# Patient Record
Sex: Female | Born: 1940 | Race: Asian | Smoking: Never smoker
Health system: Southern US, Community
[De-identification: ages and names within clinical notes are randomized; demographics above are authoritative.]

## PROBLEM LIST (undated history)

## (undated) DIAGNOSIS — I10 Essential (primary) hypertension: Secondary | ICD-10-CM

## (undated) DIAGNOSIS — E119 Type 2 diabetes mellitus without complications: Secondary | ICD-10-CM

## (undated) DIAGNOSIS — E7800 Pure hypercholesterolemia, unspecified: Secondary | ICD-10-CM

## (undated) HISTORY — PX: CATARACT EXTRACTION: SUR2

---

## 2015-04-07 ENCOUNTER — Inpatient Hospital Stay
Admission: EM | Admit: 2015-04-07 | Discharge: 2015-04-09 | DRG: 287 | Disposition: A | Discharge status: Other Acute Inpatient Hospital | Payer: Medicare Other | Attending: Internal Medicine | Admitting: Internal Medicine

## 2015-04-07 DIAGNOSIS — Z886 Allergy status to analgesic agent status: Secondary | ICD-10-CM

## 2015-04-07 DIAGNOSIS — R748 Abnormal levels of other serum enzymes: Secondary | ICD-10-CM | POA: Diagnosis present

## 2015-04-07 DIAGNOSIS — E785 Hyperlipidemia, unspecified: Secondary | ICD-10-CM | POA: Diagnosis present

## 2015-04-07 DIAGNOSIS — I5041 Acute combined systolic (congestive) and diastolic (congestive) heart failure: Secondary | ICD-10-CM | POA: Diagnosis not present

## 2015-04-07 DIAGNOSIS — I248 Other forms of acute ischemic heart disease: Secondary | ICD-10-CM | POA: Diagnosis present

## 2015-04-07 DIAGNOSIS — I509 Heart failure, unspecified: Secondary | ICD-10-CM

## 2015-04-07 DIAGNOSIS — D649 Anemia, unspecified: Secondary | ICD-10-CM | POA: Diagnosis present

## 2015-04-07 DIAGNOSIS — K529 Noninfective gastroenteritis and colitis, unspecified: Secondary | ICD-10-CM | POA: Diagnosis present

## 2015-04-07 DIAGNOSIS — R7989 Other specified abnormal findings of blood chemistry: Secondary | ICD-10-CM

## 2015-04-07 DIAGNOSIS — W19XXXA Unspecified fall, initial encounter: Secondary | ICD-10-CM | POA: Diagnosis present

## 2015-04-07 DIAGNOSIS — Z9841 Cataract extraction status, right eye: Secondary | ICD-10-CM

## 2015-04-07 DIAGNOSIS — R778 Other specified abnormalities of plasma proteins: Secondary | ICD-10-CM

## 2015-04-07 DIAGNOSIS — Z9842 Cataract extraction status, left eye: Secondary | ICD-10-CM

## 2015-04-07 DIAGNOSIS — I10 Essential (primary) hypertension: Secondary | ICD-10-CM | POA: Diagnosis present

## 2015-04-07 DIAGNOSIS — R079 Chest pain, unspecified: Secondary | ICD-10-CM

## 2015-04-07 DIAGNOSIS — E119 Type 2 diabetes mellitus without complications: Secondary | ICD-10-CM | POA: Diagnosis present

## 2015-04-07 DIAGNOSIS — E780 Pure hypercholesterolemia: Secondary | ICD-10-CM | POA: Diagnosis present

## 2015-04-07 DIAGNOSIS — Z8249 Family history of ischemic heart disease and other diseases of the circulatory system: Secondary | ICD-10-CM

## 2015-04-07 HISTORY — DX: Type 2 diabetes mellitus without complications: E11.9

## 2015-04-07 HISTORY — DX: Pure hypercholesterolemia, unspecified: E78.00

## 2015-04-07 HISTORY — DX: Essential (primary) hypertension: I10

## 2015-04-07 NOTE — ED Notes (Signed)
Pt's daughter says she fell yesterday-mis-stepped; fell onto ground on left side; now having pain to the right side of her chest, especially with deep inspiration; small abrasion to area; tender to touch;

## 2015-04-08 ENCOUNTER — Encounter: Payer: Self-pay | Admitting: Emergency Medicine

## 2015-04-08 ENCOUNTER — Emergency Department: Payer: Medicare Other

## 2015-04-08 ENCOUNTER — Inpatient Hospital Stay
Admit: 2015-04-08 | Discharge: 2015-04-08 | Disposition: A | Discharge status: Home / Self Care | Payer: Medicare Other | Attending: Internal Medicine | Admitting: Internal Medicine

## 2015-04-08 DIAGNOSIS — E785 Hyperlipidemia, unspecified: Secondary | ICD-10-CM | POA: Diagnosis present

## 2015-04-08 DIAGNOSIS — I2510 Atherosclerotic heart disease of native coronary artery without angina pectoris: Secondary | ICD-10-CM | POA: Diagnosis present

## 2015-04-08 DIAGNOSIS — D649 Anemia, unspecified: Secondary | ICD-10-CM | POA: Diagnosis present

## 2015-04-08 DIAGNOSIS — W19XXXA Unspecified fall, initial encounter: Secondary | ICD-10-CM | POA: Diagnosis present

## 2015-04-08 DIAGNOSIS — I5041 Acute combined systolic (congestive) and diastolic (congestive) heart failure: Secondary | ICD-10-CM | POA: Diagnosis present

## 2015-04-08 DIAGNOSIS — R7989 Other specified abnormal findings of blood chemistry: Secondary | ICD-10-CM | POA: Diagnosis present

## 2015-04-08 DIAGNOSIS — I509 Heart failure, unspecified: Secondary | ICD-10-CM

## 2015-04-08 DIAGNOSIS — R748 Abnormal levels of other serum enzymes: Secondary | ICD-10-CM | POA: Diagnosis present

## 2015-04-08 DIAGNOSIS — R079 Chest pain, unspecified: Secondary | ICD-10-CM | POA: Diagnosis not present

## 2015-04-08 DIAGNOSIS — Z9842 Cataract extraction status, left eye: Secondary | ICD-10-CM | POA: Diagnosis not present

## 2015-04-08 DIAGNOSIS — K529 Noninfective gastroenteritis and colitis, unspecified: Secondary | ICD-10-CM | POA: Diagnosis present

## 2015-04-08 DIAGNOSIS — Z8249 Family history of ischemic heart disease and other diseases of the circulatory system: Secondary | ICD-10-CM | POA: Diagnosis not present

## 2015-04-08 DIAGNOSIS — E780 Pure hypercholesterolemia: Secondary | ICD-10-CM | POA: Diagnosis present

## 2015-04-08 DIAGNOSIS — I10 Essential (primary) hypertension: Secondary | ICD-10-CM | POA: Diagnosis present

## 2015-04-08 DIAGNOSIS — E119 Type 2 diabetes mellitus without complications: Secondary | ICD-10-CM | POA: Diagnosis present

## 2015-04-08 DIAGNOSIS — Z9841 Cataract extraction status, right eye: Secondary | ICD-10-CM | POA: Diagnosis not present

## 2015-04-08 DIAGNOSIS — Z886 Allergy status to analgesic agent status: Secondary | ICD-10-CM | POA: Diagnosis not present

## 2015-04-08 DIAGNOSIS — I248 Other forms of acute ischemic heart disease: Secondary | ICD-10-CM | POA: Diagnosis present

## 2015-04-08 LAB — BASIC METABOLIC PANEL
ANION GAP: 9 (ref 5–15)
Anion gap: 7 (ref 5–15)
BUN: 15 mg/dL (ref 6–20)
BUN: 20 mg/dL (ref 6–20)
CALCIUM: 8.8 mg/dL — AB (ref 8.9–10.3)
CALCIUM: 8.8 mg/dL — AB (ref 8.9–10.3)
CO2: 27 mmol/L (ref 22–32)
CO2: 28 mmol/L (ref 22–32)
CREATININE: 0.8 mg/dL (ref 0.44–1.00)
Chloride: 104 mmol/L (ref 101–111)
Chloride: 97 mmol/L — ABNORMAL LOW (ref 101–111)
Creatinine, Ser: 0.72 mg/dL (ref 0.44–1.00)
GFR calc non Af Amer: >60 mL/min (ref >60)
GFR calc non Af Amer: >60 mL/min (ref >60)
GLUCOSE: 183 mg/dL — AB (ref 65–99)
Glucose, Bld: 206 mg/dL — ABNORMAL HIGH (ref 65–99)
POTASSIUM: 4 mmol/L (ref 3.5–5.1)
POTASSIUM: 4 mmol/L (ref 3.5–5.1)
SODIUM: 138 mmol/L (ref 135–145)
Sodium: 134 mmol/L — ABNORMAL LOW (ref 135–145)

## 2015-04-08 LAB — CBC
HCT: 32.2 % — ABNORMAL LOW (ref 35.0–47.0)
HEMATOCRIT: 30.1 % — AB (ref 35.0–47.0)
HEMATOCRIT: 30 % — AB (ref 35.0–47.0)
HEMOGLOBIN: 10.9 g/dL — AB (ref 12.0–16.0)
HEMOGLOBIN: 10.3 g/dL — AB (ref 12.0–16.0)
Hemoglobin: 10.4 g/dL — ABNORMAL LOW (ref 12.0–16.0)
MCH: 30.4 pg (ref 26.0–34.0)
MCH: 30.9 pg (ref 26.0–34.0)
MCH: 31 pg (ref 26.0–34.0)
MCHC: 33.7 g/dL (ref 32.0–36.0)
MCHC: 34.3 g/dL (ref 32.0–36.0)
MCHC: 34.6 g/dL (ref 32.0–36.0)
MCV: 90.2 fL (ref 80.0–100.0)
MCV: 90 fL (ref 80.0–100.0)
MCV: 89.7 fL (ref 80.0–100.0)
PLATELETS: 261 10*3/uL (ref 150–440)
PLATELETS: 254 10*3/uL (ref 150–440)
Platelets: 272 10*3/uL (ref 150–440)
RBC: 3.57 MIL/uL — AB (ref 3.80–5.20)
RBC: 3.34 MIL/uL — ABNORMAL LOW (ref 3.80–5.20)
RBC: 3.35 MIL/uL — AB (ref 3.80–5.20)
RDW: 14.7 % — ABNORMAL HIGH (ref 11.5–14.5)
RDW: 14.8 % — ABNORMAL HIGH (ref 11.5–14.5)
RDW: 14.8 % — ABNORMAL HIGH (ref 11.5–14.5)
WBC: 8.4 10*3/uL (ref 3.6–11.0)
WBC: 9.5 10*3/uL (ref 3.6–11.0)
WBC: 9.8 10*3/uL (ref 3.6–11.0)

## 2015-04-08 LAB — APTT: APTT: 32 s (ref 24–36)

## 2015-04-08 LAB — GLUCOSE, CAPILLARY
GLUCOSE-CAPILLARY: 186 mg/dL — AB (ref 65–99)
GLUCOSE-CAPILLARY: 215 mg/dL — AB (ref 65–99)
GLUCOSE-CAPILLARY: 154 mg/dL — AB (ref 65–99)
Glucose-Capillary: 205 mg/dL — ABNORMAL HIGH (ref 65–99)
Glucose-Capillary: 194 mg/dL — ABNORMAL HIGH (ref 65–99)

## 2015-04-08 LAB — COMPREHENSIVE METABOLIC PANEL
ALT: 29 U/L (ref 14–54)
ANION GAP: 9 (ref 5–15)
AST: 32 U/L (ref 15–41)
Albumin: 3.4 g/dL — ABNORMAL LOW (ref 3.5–5.0)
Alkaline Phosphatase: 79 U/L (ref 38–126)
BUN: 15 mg/dL (ref 6–20)
CO2: 27 mmol/L (ref 22–32)
Calcium: 9 mg/dL (ref 8.9–10.3)
Chloride: 101 mmol/L (ref 101–111)
Creatinine, Ser: 0.66 mg/dL (ref 0.44–1.00)
GFR calc Af Amer: >60 mL/min (ref >60)
Glucose, Bld: 203 mg/dL — ABNORMAL HIGH (ref 65–99)
POTASSIUM: 3.9 mmol/L (ref 3.5–5.1)
SODIUM: 137 mmol/L (ref 135–145)
Total Bilirubin: 0.5 mg/dL (ref 0.3–1.2)
Total Protein: 6.7 g/dL (ref 6.5–8.1)

## 2015-04-08 LAB — TROPONIN I
TROPONIN I: 0.49 ng/mL — AB (ref <0.031)
TROPONIN I: 0.48 ng/mL — AB (ref <0.031)
Troponin I: 0.45 ng/mL — ABNORMAL HIGH (ref <0.031)

## 2015-04-08 LAB — HEPARIN LEVEL (UNFRACTIONATED)
HEPARIN UNFRACTIONATED: 0.44 [IU]/mL (ref 0.30–0.70)
Heparin Unfractionated: 0.18 IU/mL — ABNORMAL LOW (ref 0.30–0.70)

## 2015-04-08 LAB — BRAIN NATRIURETIC PEPTIDE: B Natriuretic Peptide: 542 pg/mL — ABNORMAL HIGH (ref 0.0–100.0)

## 2015-04-08 LAB — PROTIME-INR
INR: 0.99
Prothrombin Time: 13.3 seconds (ref 11.4–15.0)

## 2015-04-08 LAB — HEMOGLOBIN A1C: Hgb A1c MFr Bld: 7.2 % — ABNORMAL HIGH (ref 4.0–6.0)

## 2015-04-08 MED ORDER — ONDANSETRON HCL 4 MG PO TABS: 4.0000 mg | ORAL_TABLET | Freq: Four times a day (QID) | ORAL | Status: DC | PRN

## 2015-04-08 MED ORDER — INSULIN ASPART 100 UNIT/ML ~~LOC~~ SOLN
0.0000 [IU] | Freq: Three times a day (TID) | SUBCUTANEOUS | Status: DC
  Administered 2015-04-08: 3 [IU] via SUBCUTANEOUS
  Administered 2015-04-08: 2 [IU] via SUBCUTANEOUS
  Administered 2015-04-08: 3 [IU] via SUBCUTANEOUS
  Filled 2015-04-08: qty 2
  Filled 2015-04-08 (×2): qty 3

## 2015-04-08 MED ORDER — ONDANSETRON HCL 4 MG/2ML IJ SOLN: 4.0000 mg | Freq: Four times a day (QID) | INTRAMUSCULAR | Status: DC | PRN

## 2015-04-08 MED ORDER — HEPARIN (PORCINE) IN NACL 100-0.45 UNIT/ML-% IJ SOLN
800.0000 [IU]/h | INTRAMUSCULAR | Status: DC
  Filled 2015-04-08 (×3): qty 250

## 2015-04-08 MED ORDER — SODIUM CHLORIDE 0.9 % IJ SOLN: 3.0000 mL | INTRAMUSCULAR | Status: DC | PRN

## 2015-04-08 MED ORDER — ASPIRIN 81 MG PO CHEW
81.0000 mg | CHEWABLE_TABLET | ORAL | Status: AC
  Administered 2015-04-09: 81 mg via ORAL
  Filled 2015-04-08: qty 1

## 2015-04-08 MED ORDER — PNEUMOCOCCAL 13-VAL CONJ VACC IM SUSP: 0.5000 mL | INTRAMUSCULAR | Status: DC

## 2015-04-08 MED ORDER — HEPARIN BOLUS VIA INFUSION
2600.0000 [IU] | Freq: Once | INTRAVENOUS | Status: AC
  Administered 2015-04-08: 2600 [IU] via INTRAVENOUS
  Filled 2015-04-08: qty 2600

## 2015-04-08 MED ORDER — CARVEDILOL 6.25 MG PO TABS
6.2500 mg | ORAL_TABLET | Freq: Two times a day (BID) | ORAL | Status: DC
  Administered 2015-04-08 – 2015-04-09 (×3): 6.25 mg via ORAL
  Filled 2015-04-08 (×3): qty 1

## 2015-04-08 MED ORDER — SODIUM CHLORIDE 0.9 % IJ SOLN
3.0000 mL | Freq: Two times a day (BID) | INTRAMUSCULAR | Status: DC
  Administered 2015-04-09: 3 mL via INTRAVENOUS

## 2015-04-08 MED ORDER — SODIUM CHLORIDE 0.9 % WEIGHT BASED INFUSION
3.0000 mL/kg/h | INTRAVENOUS | Status: AC
  Administered 2015-04-09: 3 mL/kg/h via INTRAVENOUS

## 2015-04-08 MED ORDER — MORPHINE SULFATE 2 MG/ML IJ SOLN: 2.0000 mg | INTRAMUSCULAR | Status: DC | PRN

## 2015-04-08 MED ORDER — SODIUM CHLORIDE 0.9 % IV SOLN: 250.0000 mL | INTRAVENOUS | Status: DC | PRN

## 2015-04-08 MED ORDER — ACETAMINOPHEN 325 MG PO TABS: 650.0000 mg | ORAL_TABLET | Freq: Four times a day (QID) | ORAL | Status: DC | PRN

## 2015-04-08 MED ORDER — OXYCODONE-ACETAMINOPHEN 5-325 MG PO TABS
1.0000 | ORAL_TABLET | Freq: Once | ORAL | Status: AC
  Administered 2015-04-08: 1 via ORAL
  Filled 2015-04-08: qty 1

## 2015-04-08 MED ORDER — FUROSEMIDE 10 MG/ML IJ SOLN
20.0000 mg | Freq: Two times a day (BID) | INTRAMUSCULAR | Status: DC
  Administered 2015-04-08 – 2015-04-09 (×3): 20 mg via INTRAVENOUS
  Filled 2015-04-08 (×3): qty 2

## 2015-04-08 MED ORDER — INSULIN ASPART 100 UNIT/ML ~~LOC~~ SOLN: 0.0000 [IU] | Freq: Every day | SUBCUTANEOUS | Status: DC

## 2015-04-08 MED ORDER — ALBUTEROL SULFATE (2.5 MG/3ML) 0.083% IN NEBU: 2.5000 mg | INHALATION_SOLUTION | RESPIRATORY_TRACT | Status: DC | PRN

## 2015-04-08 MED ORDER — HEPARIN (PORCINE) IN NACL 100-0.45 UNIT/ML-% IJ SOLN
650.0000 [IU]/h | INTRAMUSCULAR | Status: DC
  Filled 2015-04-08: qty 250

## 2015-04-08 MED ORDER — ATORVASTATIN CALCIUM 20 MG PO TABS
40.0000 mg | ORAL_TABLET | Freq: Every day | ORAL | Status: DC
  Administered 2015-04-08: 40 mg via ORAL
  Filled 2015-04-08: qty 2

## 2015-04-08 MED ORDER — HEPARIN (PORCINE) IN NACL 100-0.45 UNIT/ML-% IJ SOLN
12.0000 [IU]/kg/h | INTRAMUSCULAR | Status: DC
  Administered 2015-04-08: 12 [IU]/kg/h via INTRAVENOUS
  Filled 2015-04-08: qty 250

## 2015-04-08 MED ORDER — SODIUM CHLORIDE 0.9 % WEIGHT BASED INFUSION: 1.0000 mL/kg/h | INTRAVENOUS | Status: DC

## 2015-04-08 MED ORDER — HEPARIN BOLUS VIA INFUSION
1300.0000 [IU] | Freq: Once | INTRAVENOUS | Status: AC
  Administered 2015-04-08: 1300 [IU] via INTRAVENOUS
  Filled 2015-04-08: qty 1300

## 2015-04-08 MED ORDER — ACETAMINOPHEN 650 MG RE SUPP: 650.0000 mg | Freq: Four times a day (QID) | RECTAL | Status: DC | PRN

## 2015-04-08 MED ORDER — LISINOPRIL 10 MG PO TABS
10.0000 mg | ORAL_TABLET | Freq: Every day | ORAL | Status: DC
  Administered 2015-04-08 – 2015-04-09 (×2): 10 mg via ORAL
  Filled 2015-04-08 (×2): qty 1

## 2015-04-08 MED ORDER — HYDROCODONE-ACETAMINOPHEN 5-325 MG PO TABS
1.0000 | ORAL_TABLET | ORAL | Status: DC | PRN
  Administered 2015-04-08: 1 via ORAL
  Filled 2015-04-08: qty 1

## 2015-04-08 MED ORDER — SODIUM CHLORIDE 0.9 % IJ SOLN
3.0000 mL | Freq: Two times a day (BID) | INTRAMUSCULAR | Status: DC
  Administered 2015-04-08: 3 mL via INTRAVENOUS

## 2015-04-08 NOTE — Progress Notes (Signed)
Pt refuses to use intrepetor. NSR. Room air. EF 60-65%. Pt reported HA and received norco. Family at the bedside. Heparin drip at 8 ml/hr. Pt has no further concerns at this time.

## 2015-04-08 NOTE — Plan of Care (Signed)
Problem: Consults Goal: Heart Failure Patient Education (See Patient Education module for education specifics.)  Outcome: Progressing Unable to do due to language barrier

## 2015-04-08 NOTE — ED Provider Notes (Signed)
[facility] Emergency Department Provider Note  ____________________________________________  Time seen: Approximately 0040 AM  I have reviewed the triage vital signs and the nursing notes.   HISTORY  Chief Complaint Fall and Chest Pain  There is a language barrier  HPI Janice Winters is a 74 y.o. female who fell yesterday and hit her right side. [name] reports thatthe patient was leaning over and fell onto her right side. The patient reports now though that she feels as though she can't take a deep breath because it hurts on that side. The patient has been taking Tylenol for pain but it has not been helping. Her pain is 8 out of 10 in intensity. The patient did not have any pain before she fell but currently she has pain. She did not hit her head. She is felt gas in her stomach but has not had any vomiting. The patient's family came in because she was having so much discomfort.   Past Medical History  Diagnosis Date  . Hypertension   . Diabetes mellitus without complication   . High cholesterol     Patient Active Problem List   Diagnosis Date Noted  . Acute CHF 04/08/2015    Past Surgical History  Procedure Laterality Date  . Cataract extraction Bilateral     No current outpatient prescriptions on file.  Allergies Aspirin  History reviewed. No pertinent family history.  Social History History  Substance Use Topics  . Smoking status: Never Smoker   . Smokeless tobacco: Never Used  . Alcohol Use: No    Review of Systems Constitutional: No fever/chills Eyes: No visual changes. ENT: No sore throat. Cardiovascular:chest pain. Respiratory: shortness of breath. Gastrointestinal: No abdominal pain.  No nausea, no vomiting.  No diarrhea.  No constipation. Genitourinary: Negative for dysuria. Musculoskeletal: Negative for back pain. Skin: Negative for rash. Neurological: Negative for headaches, focal weakness or  numbness.  10-point ROS otherwise negative.  ____________________________________________   PHYSICAL EXAM:  VITAL SIGNS: ED Triage Vitals  Enc Vitals Group     BP 04/07/15 2248 139/70 mmHg     Pulse Rate 04/07/15 2248 82     Resp --      Temp 04/07/15 2248 98.2 F (36.8 C)     Temp Source 04/07/15 2248 Oral     SpO2 04/07/15 2248 94 %     Weight 04/07/15 2248 99 lb (44.906 kg)     Height 04/07/15 2248 4\' 9"  (1.448 m)     Head Cir --      Peak Flow --      Pain Score 04/07/15 2248 8     Pain Loc --      Pain Edu? --      Excl. in GC? --     Constitutional: Alert and oriented. Well appearing and in no acute distress. Eyes: Conjunctivae are normal. PERRL. EOMI. Head: Atraumatic. Nose: No congestion/rhinnorhea. Mouth/Throat: Mucous membranes are moist.  Oropharynx non-erythematous. Neck: No cervical spine tenderness to palpation. Cardiovascular: Normal rate, regular rhythm. Grossly normal heart sounds.  Good peripheral circulation. Respiratory: Normal respiratory effort.  No retractions. Crackles in bilateral bases. Tenderness to palpation along patient's right lateral chest Gastrointestinal: Soft and nontender. No distention. Positive bowel sounds Genitourinary: Deferred Musculoskeletal: No lower extremity tenderness nor edema.   Neurologic:  Normal speech and language. No gross focal neurologic deficits are appreciated.  Skin:  Bruising to patient's right side Psychiatric: Mood and affect are normal.   ____________________________________________  LABS (all labs ordered are listed, but only abnormal results are displayed)  Labs Reviewed  CBC - Abnormal; Notable for the following:    RBC 3.57 (*)    Hemoglobin 10.9 (*)    HCT 32.2 (*)    RDW 14.7 (*)    All other components within normal limits  COMPREHENSIVE METABOLIC PANEL - Abnormal; Notable for the following:    Glucose, Bld 203 (*)    Albumin 3.4 (*)    All other components within normal limits  TROPONIN  I - Abnormal; Notable for the following:    Troponin I 0.49 (*)    All other components within normal limits  BRAIN NATRIURETIC PEPTIDE - Abnormal; Notable for the following:    B Natriuretic Peptide 542.0 (*)    All other components within normal limits   ____________________________________________  EKG  ED ECG REPORT I, Rebecka Apley, the attending physician, personally viewed and interpreted this ECG.   Date: 04/07/2015  EKG Time: 2254  Rate: 79  Rhythm: normal sinus rhythm  Axis: normal  Intervals:none  ST&T Change: none  ____________________________________________  RADIOLOGY  Chest x-ray: Vascular congestion and mild cardiomegaly with increased interstitial markings concerning for mild interstitial edema ____________________________________________   PROCEDURES  Procedure(s) performed: None  Critical Care performed: No  ____________________________________________   INITIAL IMPRESSION / ASSESSMENT AND PLAN / ED COURSE  Pertinent labs & imaging results that were available during my care of the patient were reviewed by me and considered in my medical decision making (see chart for details).  This is a 74 year old female who comes in today with some chest pain after a fall with some bruising and tenderness to palpation along the area where she fell. The patient does have some crackles in her lung bases so I did some blood work. On this blood work it was found that the patient had an abnormal troponin of 0.49. I will admit the patient to the hospital for further evaluation of this elevated troponin. The patient also did receive a dose of Percocet for her pain. I discussed the case with the hospitalist and they would like the patient started on heparin for her chest pain. ____________________________________________   FINAL CLINICAL IMPRESSION(S) / ED DIAGNOSES  Final diagnoses:  Chest pain, unspecified chest pain type  Elevated troponin      Rebecka Apley, MD 04/08/15 [phone]

## 2015-04-08 NOTE — Progress Notes (Addendum)
[facility] [place] [facility]   PATIENT NAME: Janice Winters    MR#:  [id number]  DATE OF BIRTH:  Apr 10, 1941  SUBJECTIVE:  Patient's daughter is at bedside. Patient is not having any pain at this time. She has bouts of shortness of breath. She denies any chest pain or abdominal pain.  REVIEW OF SYSTEMS:    Review of Systems  Constitutional: Negative for fever, chills and malaise/fatigue.  HENT: Negative for sore throat.   Eyes: Negative for blurred vision.  Respiratory: Positive for shortness of breath. Negative for cough, hemoptysis and wheezing.   Cardiovascular: Positive for leg swelling. Negative for chest pain and palpitations.  Gastrointestinal: Negative for nausea, vomiting, abdominal pain, diarrhea and blood in stool.  Genitourinary: Negative for dysuria.  Musculoskeletal: Negative for back pain.  Neurological: Negative for dizziness, tremors and headaches.  Endo/Heme/Allergies: Does not bruise/bleed easily.    Tolerating Diet: Yes      DRUG ALLERGIES:   Allergies  Allergen Reactions  . Aspirin Diarrhea    VITALS:  Blood pressure 156/68, pulse 91, temperature 97.8 F (36.6 C), temperature source Oral, resp. rate 20, height 4\' 9"  (1.448 m), weight 43.863 kg (96 lb 11.2 oz), SpO2 96 %.  PHYSICAL EXAMINATION:   Physical Exam  Constitutional: She is oriented to person, place, and time and well-developed, well-nourished, and in no distress. No distress.  HENT:  Head: Normocephalic.  Eyes: No scleral icterus.  Neck: Normal range of motion. Neck supple. No JVD present. No tracheal deviation present.  Cardiovascular: Normal rate, regular rhythm and normal heart sounds.  Exam reveals no gallop and no friction rub.   No murmur heard. Pulmonary/Chest: Effort normal and breath sounds normal. No respiratory distress. She has no wheezes. She has no rales. She exhibits no tenderness.  Abdominal: Soft. Bowel sounds are normal. She exhibits no  distension and no mass. There is no tenderness. There is no rebound and no guarding.  Musculoskeletal: Normal range of motion. She exhibits edema.  Neurological: She is alert and oriented to person, place, and time.  Skin: Skin is warm. No rash noted. No erythema.  Psychiatric: Affect and judgment normal.      LABORATORY PANEL:   CBC  Recent Labs Lab 04/08/15 0525  WBC 9.8  HGB 10.3*  HCT 30.1*  PLT 261   ------------------------------------------------------------------------------------------------------------------  Chemistries   Recent Labs Lab 04/08/15 0156 04/08/15 0525  NA 137 138  K 3.9 4.0  CL 101 104  CO2 27 27  GLUCOSE 203* 183*  BUN 15 15  CREATININE 0.66 0.72  CALCIUM 9.0 8.8*  AST 32  --   ALT 29  --   ALKPHOS 79  --   BILITOT 0.5  --    ------------------------------------------------------------------------------------------------------------------  Cardiac Enzymes  Recent Labs Lab 04/08/15 0156 04/08/15 0525  TROPONINI 0.49* 0.48*   ------------------------------------------------------------------------------------------------------------------  RADIOLOGY:  Dg Chest 2 View  04/08/2015   CLINICAL DATA:  Status post fall, with right anterior and lateral chest pain. Initial encounter.  EXAM: CHEST  2 VIEW  COMPARISON:  None.  FINDINGS: The lungs are well-aerated. Vascular congestion is noted, with increased interstitial markings, concerning for mild interstitial edema. There is no evidence of pleural effusion or pneumothorax.  The heart is mildly enlarged. No acute osseous abnormalities are seen.  IMPRESSION: Vascular congestion and mild cardiomegaly, with increased interstitial markings, concerning for mild interstitial edema.   Electronically Signed   By: Roanna Raider M.D.   On: 04/08/2015 01:01  ASSESSMENT AND PLAN:   74 year old female with history of essential hypertension, diabetes who presented to the ER after a fall and  complaining of chest pain and found to have CHF and lower extremity edema.  1. Acute CHF exacerbation: Chest x-ray is consistent with pulmonary edema as well as a slightly elevated BNP of over 500. Patient does have some very minimal lower extremity edema. Her lungs on examination have no crackles. I will continue Lasix. I have ordered a 2-D echo Cardigan to further evaluate cardiac function. Cardiac consultation has been placed.  She will also continue Coreg. Continue to monitor I's and O's and daily.  2. Elevated troponin: This is likely secondary to acute exacerbation of CHF and may be due to supply demand ischemia. Patient is currently a heparin drip. Her troponin is trending downward. Cardiology has been consulted. I will continue heparin until further evaluation and management as per cardiology. Patient should continue atorvastatin and Coreg. She is allergic to aspirin  3. Diabetes: Patient is currently on sliding scale insulin which I will continue.  4. Hypertension: Patient's on Coreg and lisinopril. Blood pressure is acceptable.   Management plans discussed with the patient and daughter and they are in agreement.  CODE STATUS: Full  TOTAL TIME TAKING CARE OF THIS PATIENT: 30 minutes.     POSSIBLE D/C tomorrow, DEPENDING ON CLINICAL CONDITION.   Dulce Martian M.D on 04/08/2015 at 10:11 AM  Between 7am to 6pm - Pager - [phone] After 6pm go to [email or web address] - password [email or web address] [facility]  [place] [facility]  Office  [phone]  CC: Primary care physician; No primary care provider on file.

## 2015-04-08 NOTE — Progress Notes (Signed)
ANTICOAGULATION CONSULT NOTE - Initial Consult  Pharmacy Consult for Heparin Indication: chest pain/ACS  Allergies  Allergen Reactions  . Aspirin Diarrhea    Patient Measurements: Height:  (144.8 cm) Weight: 96 lb 11.2 oz (43.863 kg) IBW/kg (Calculated) : 38.6 Heparin Dosing Weight: 43.9 kg  Vital Signs: Temp: 98.5 F (36.9 C) (08/07 1130) Temp Source: Oral (08/07 1130) BP: 129/59 mmHg (08/07 1130) Pulse Rate: 92 (08/07 1130)  Labs:  Recent Labs  04/08/15 0156 04/08/15 0525 04/08/15 1350  HGB 10.9* 10.3*  --   HCT 32.2* 30.1*  --   PLT 272 261  --   APTT 32  --   --   LABPROT 13.3  --   --   INR 0.99  --   --   HEPARINUNFRC  --   --  0.18*  CREATININE 0.66 0.72  --   TROPONINI 0.49* 0.48* 0.45*    Estimated Creatinine Clearance: 37.6 mL/min (by C-G formula based on Cr of 0.72).   Medical History: Past Medical History  Diagnosis Date  . Hypertension   . Diabetes mellitus without complication   . High cholesterol     Medications:  Scheduled:  . atorvastatin  40 mg Oral q1800  . carvedilol  6.25 mg Oral BID WC  . furosemide  20 mg Intravenous Q12H  . insulin aspart  0-5 Units Subcutaneous QHS  . insulin aspart  0-9 Units Subcutaneous TID WC  . lisinopril  10 mg Oral Daily  . [START ON 04/09/2015] pneumococcal 13-valent conjugate vaccine  0.5 mL Intramuscular Tomorrow-1000  . sodium chloride  3 mL Intravenous Q12H   Infusions:  . heparin 12 Units/kg/hr (04/08/15 0610)   PRN: sodium chloride, acetaminophen **OR** acetaminophen, albuterol, HYDROcodone-acetaminophen, morphine injection, ondansetron **OR** ondansetron (ZOFRAN) IV, sodium chloride  Assessment: 74 y/o F with admitted with acute CHF and elevated troponin, r/o NSTEMI. Per RN, patient's daughter reports no anticoagulants PTA.   Heparin level subtherapeutic  Goal of Therapy:  Heparin level 0.3-0.7 units/ml Monitor platelets by anticoagulation protocol: Yes   Plan:  Current orders for  heparin 650 units/hr. Will order bolus of 1300 units and increase rate to 800 units/hr  Will recheck heparin level in 8 hours, CBC with AM labs.  Garlon Hatchet, PharmD Clinical Pharmacist  04/08/2015,3:00 PM

## 2015-04-08 NOTE — Progress Notes (Signed)
ANTICOAGULATION CONSULT NOTE - Initial Consult  Pharmacy Consult for Heparin Indication: chest pain/ACS  Allergies  Allergen Reactions  . Aspirin Diarrhea    Patient Measurements: Height:  (144.8 cm) Weight: 96 lb 11.2 oz (43.863 kg) IBW/kg (Calculated) : 38.6 Heparin Dosing Weight: 43.9 kg  Vital Signs: Temp: 97.8 F (36.6 C) (08/07 0506) Temp Source: Oral (08/07 0506) BP: 149/65 mmHg (08/07 0506) Pulse Rate: 79 (08/07 0506)  Labs:  Recent Labs  04/08/15 0156  HGB 10.9*  HCT 32.2*  PLT 272  CREATININE 0.66  TROPONINI 0.49*    Estimated Creatinine Clearance: 37.6 mL/min (by C-G formula based on Cr of 0.66).   Medical History: Past Medical History  Diagnosis Date  . Hypertension   . Diabetes mellitus without complication   . High cholesterol     Medications:  Scheduled:  . atorvastatin  40 mg Oral q1800  . carvedilol  6.25 mg Oral BID WC  . furosemide  20 mg Intravenous Q12H  . heparin  2,600 Units Intravenous Once  . insulin aspart  0-5 Units Subcutaneous QHS  . insulin aspart  0-9 Units Subcutaneous TID WC  . lisinopril  10 mg Oral Daily  . sodium chloride  3 mL Intravenous Q12H   Infusions:  . heparin     PRN: sodium chloride, acetaminophen **OR** acetaminophen, albuterol, HYDROcodone-acetaminophen, morphine injection, ondansetron **OR** ondansetron (ZOFRAN) IV, sodium chloride  Assessment: 74 y/o F with admitted with acute CHF and elevated troponin, r/o NSTEMI. Per RN, patient's daughter reports no anticoagulants PTA.   Goal of Therapy:  Heparin level 0.3-0.7 units/ml Monitor platelets by anticoagulation protocol: Yes   Plan:  Give 2600 units bolus x 1 Start heparin infusion at 650 units/hr Check anti-Xa level in 8 hours and daily while on heparin Continue to monitor H&H and platelets  Numa Heatwole D 04/08/2015,5:31 AM

## 2015-04-08 NOTE — Progress Notes (Signed)
Janice Winters is a 74 y.o. female  [id number]  Primary Cardiologist: Adrian Blackwater Reason for Consultation: CHF and elevated troponin  HPI: This is a 74 year old female from [place], presented to the emergency room with shortness of breath and pain in the chest. The chest pain actually was on the right side after falling down. According to the family member she fell on Friday and came to the hospital yesterday because she was very short of breath. Her troponin was 0.49 and chest x-ray showed pulmonary edema. She still having intermittent shortness of breath and chest pain. She has multiple risk factor for coronary artery disease including hypertension diabetes hyperlipidemia and heart age.Review of Systems  Respiratory: Positive for cough, shortness of breath and wheezing.   All other systems reviewed and are negative.        Past Medical History  Diagnosis Date  . Hypertension   . Diabetes mellitus without complication   . High cholesterol     No prescriptions prior to admission     . atorvastatin  40 mg Oral q1800  . carvedilol  6.25 mg Oral BID WC  . furosemide  20 mg Intravenous Q12H  . insulin aspart  0-5 Units Subcutaneous QHS  . insulin aspart  0-9 Units Subcutaneous TID WC  . lisinopril  10 mg Oral Daily  . [START ON 04/09/2015] pneumococcal 13-valent conjugate vaccine  0.5 mL Intramuscular Tomorrow-1000  . sodium chloride  3 mL Intravenous Q12H    Infusions: . heparin 12 Units/kg/hr (04/08/15 0610)    Allergies  Allergen Reactions  . Aspirin Diarrhea    History   Social History  . Marital Status: Widowed    Spouse Name: N/A  . Number of Children: N/A  . Years of Education: N/A   Occupational History  . Not on file.   Social History Main Topics  . Smoking status: Never Smoker   . Smokeless tobacco: Never Used  . Alcohol Use: No  . Drug Use: No  . Sexual Activity: No   Other Topics Concern  . Not on file   Social History Narrative  . No narrative on  file    Family History  Problem Relation Age of Onset  . Hypertension Other     PHYSICAL EXAM: Filed Vitals:   04/08/15 1130  BP: 129/59  Pulse: 92  Temp: 98.5 F (36.9 C)  Resp: 18     Intake/Output Summary (Last 24 hours) at 04/08/15 1157 Last data filed at 04/08/15 0952  Gross per 24 hour  Intake 244.58 ml  Output    600 ml  Net -355.42 ml    General:  Well appearing. No respiratory difficulty HEENT: normal Neck: supple. no JVD. Carotids 2+ bilat; no bruits. No lymphadenopathy or thryomegaly appreciated. Cor: PMI nondisplaced. Regular rate & rhythm. No rubs, gallops or murmurs. Lungs: clear Abdomen: soft, nontender, nondistended. No hepatosplenomegaly. No bruits or masses. Good bowel sounds. Extremities: no cyanosis, clubbing, rash, edema Neuro: alert & oriented x 3, cranial nerves grossly intact. moves all 4 extremities w/o difficulty. Affect pleasant.  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  Results for orders placed or performed during the hospital encounter of 04/07/15 (from the past 24 hour(s))  CBC     Status: Abnormal   Collection Time: 04/08/15  1:56 AM  Result Value Ref Range   WBC 9.5 3.6 - 11.0 K/uL   RBC 3.57 (L) 3.80 - 5.20 MIL/uL   Hemoglobin  10.9 (L) 12.0 - 16.0 g/dL   HCT 16.1 (L) 09.6 - 04.5 %   MCV 90.2 80.0 - 100.0 fL   MCH 30.4 26.0 - 34.0 pg   MCHC 33.7 32.0 - 36.0 g/dL   RDW 40.9 (H) 81.1 - 91.4 %   Platelets 272 150 - 440 K/uL  Comprehensive metabolic panel     Status: Abnormal   Collection Time: 04/08/15  1:56 AM  Result Value Ref Range   Sodium 137 135 - 145 mmol/L   Potassium 3.9 3.5 - 5.1 mmol/L   Chloride 101 101 - 111 mmol/L   CO2 27 22 - 32 mmol/L   Glucose, Bld 203 (H) 65 - 99 mg/dL   BUN 15 6 - 20 mg/dL   Creatinine, Ser 7.82 0.44 - 1.00 mg/dL   Calcium 9.0 8.9 - 95.6 mg/dL   Total Protein 6.7 6.5 - 8.1 g/dL   Albumin 3.4 (L) 3.5 - 5.0 g/dL   AST 32 15 - 41 U/L   ALT 29 14  - 54 U/L   Alkaline Phosphatase 79 38 - 126 U/L   Total Bilirubin 0.5 0.3 - 1.2 mg/dL   GFR calc non Af Amer >60 >60 mL/min   GFR calc Af Amer >60 >60 mL/min   Anion gap 9 5 - 15  Troponin I     Status: Abnormal   Collection Time: 04/08/15  1:56 AM  Result Value Ref Range   Troponin I 0.49 (H) <0.031 ng/mL  APTT     Status: None   Collection Time: 04/08/15  1:56 AM  Result Value Ref Range   aPTT 32 24 - 36 seconds  Protime-INR     Status: None   Collection Time: 04/08/15  1:56 AM  Result Value Ref Range   Prothrombin Time 13.3 11.4 - 15.0 seconds   INR 0.99   Brain natriuretic peptide     Status: Abnormal   Collection Time: 04/08/15  1:58 AM  Result Value Ref Range   B Natriuretic Peptide 542.0 (H) 0.0 - 100.0 pg/mL  Basic metabolic panel     Status: Abnormal   Collection Time: 04/08/15  5:25 AM  Result Value Ref Range   Sodium 138 135 - 145 mmol/L   Potassium 4.0 3.5 - 5.1 mmol/L   Chloride 104 101 - 111 mmol/L   CO2 27 22 - 32 mmol/L   Glucose, Bld 183 (H) 65 - 99 mg/dL   BUN 15 6 - 20 mg/dL   Creatinine, Ser 2.13 0.44 - 1.00 mg/dL   Calcium 8.8 (L) 8.9 - 10.3 mg/dL   GFR calc non Af Amer >60 >60 mL/min   GFR calc Af Amer >60 >60 mL/min   Anion gap 7 5 - 15  CBC     Status: Abnormal   Collection Time: 04/08/15  5:25 AM  Result Value Ref Range   WBC 9.8 3.6 - 11.0 K/uL   RBC 3.34 (L) 3.80 - 5.20 MIL/uL   Hemoglobin 10.3 (L) 12.0 - 16.0 g/dL   HCT 08.6 (L) 57.8 - 46.9 %   MCV 90.0 80.0 - 100.0 fL   MCH 30.9 26.0 - 34.0 pg   MCHC 34.3 32.0 - 36.0 g/dL   RDW 62.9 (H) 52.8 - 41.3 %   Platelets 261 150 - 440 K/uL  Troponin I     Status: Abnormal   Collection Time: 04/08/15  5:25 AM  Result Value Ref Range   Troponin I 0.48 (H) <0.031 ng/mL  Glucose, capillary     Status: Abnormal   Collection Time: 04/08/15  6:48 AM  Result Value Ref Range   Glucose-Capillary 186 (H) 65 - 99 mg/dL  Glucose, capillary     Status: Abnormal   Collection Time: 04/08/15  7:44 AM   Result Value Ref Range   Glucose-Capillary 215 (H) 65 - 99 mg/dL   Comment 1 Notify RN   Glucose, capillary     Status: Abnormal   Collection Time: 04/08/15 11:28 AM  Result Value Ref Range   Glucose-Capillary 205 (H) 65 - 99 mg/dL   Comment 1 Notify RN    Dg Chest 2 View  04/08/2015   CLINICAL DATA:  Status post fall, with right anterior and lateral chest pain. Initial encounter.  EXAM: CHEST  2 VIEW  COMPARISON:  None.  FINDINGS: The lungs are well-aerated. Vascular congestion is noted, with increased interstitial markings, concerning for mild interstitial edema. There is no evidence of pleural effusion or pneumothorax.  The heart is mildly enlarged. No acute osseous abnormalities are seen.  IMPRESSION: Vascular congestion and mild cardiomegaly, with increased interstitial markings, concerning for mild interstitial edema.   Electronically Signed   By: Roanna Raider M.D.   On: 04/08/2015 01:01     ASSESSMENT AND PLAN: CHF with interstitial changes on the chest x-ray and shortness of breath and elevated troponin. Patient is also having atypical chest pain but has multiple risk factors for coronary artery disease including hypertension diabetes hyperlipidemia and her age. Troponin is 0.49 with second set pending. Elevated troponin can be due to CHF, but needs to be ruled out for coronary artery disease. Advise cardiac catheterization.  KHAN,SHAUKAT A

## 2015-04-08 NOTE — ED Notes (Signed)
Pt daughter reports pt fell yesterday landing on her left chest and is now having pain to her right chest at the lower level of her rib cage. Pt has a bruise on her right lower chest. Pt daughter also reports pt has intermittent co's of feeling a little short of breath. Pt is alert and talking without difficulty but in her native language. (Pt does not wish to have interpreter at this time.)

## 2015-04-08 NOTE — ED Notes (Signed)
Dr. Zenda Alpers notified of critical Troponin  Of 0.49.  Acknowledged, no new orders.

## 2015-04-08 NOTE — H&P (Addendum)
[facility] - [place] at [facility]   PATIENT NAME: Janice Winters    MR#:  [id number]  DATE OF BIRTH:  [birth year]  DATE OF ADMISSION:  04/07/2015  PRIMARY CARE PHYSICIAN: No primary care provider on file.   REQUESTING/REFERRING PHYSICIAN: Rebecka Apley, MD  CHIEF COMPLAINT:   Chief Complaint  Patient presents with  . Fall  . Chest Pain   fall and chest pain yesterday.  HISTORY OF PRESENT ILLNESS:  Janice Winters  is a 74 y.o. female with a known history of hypertension, diabetes, hyperlipidemia and cellulitis. female who fell yesterday and hit her right side. [name] reports thatthe patient was leaning over and fell onto her right side. The patient cannot take deep breaths because of chest pain on the right side. She also complains of substernal chest pain during the physical examination. According to patient's daughter, patient has  shortness of breath and leg swelling recently. She was on oxygen due to shortness of breath for months. But she denies any body weight change. Patient was found to have elevated troponin at 0.49. Chest x-ray show pulmonary edema. [name] says that the patient has chronic diarrhea.  PAST MEDICAL HISTORY:   Past Medical History  Diagnosis Date  . Hypertension   . Diabetes mellitus without complication   . High cholesterol     PAST SURGICAL HISTORY:   Past Surgical History  Procedure Laterality Date  . Cataract extraction Bilateral     SOCIAL HISTORY:   History  Substance Use Topics  . Smoking status: Never Smoker   . Smokeless tobacco: Never Used  . Alcohol Use: No    FAMILY HISTORY:   Family History  Problem Relation Age of Onset  . Hypertension Other     DRUG ALLERGIES:   Allergies  Allergen Reactions  . Aspirin Diarrhea    REVIEW OF SYSTEMS:  CONSTITUTIONAL: No fever, has weakness.  EYES: No blurred or double vision.  EARS, NOSE, AND THROAT: No tinnitus or ear pain.   RESPIRATORY: No cough, has shortness of breath, no wheezing or hemoptysis.  CARDIOVASCULAR: Has chest pain, no orthopnea, edema.  GASTROINTESTINAL: No nausea, vomiting, has chronic diarrhea but no abdominal pain.  GENITOURINARY: No dysuria, hematuria.  ENDOCRINE: No polyuria, nocturia,  HEMATOLOGY: No anemia, easy bruising or bleeding SKIN: No rash or lesion. MUSCULOSKELETAL: No joint pain or arthritis.   NEUROLOGIC: No tingling, numbness, weakness.  PSYCHIATRY: No anxiety or depression.   MEDICATIONS AT HOME:   Prior to Admission medications   Not on File      VITAL SIGNS:  Blood pressure 169/90, pulse 88, temperature 98.2 F (36.8 C), temperature source Oral, height  (1.448 m), weight 44.906 kg (99 lb), SpO2 94 %.  PHYSICAL EXAMINATION:  GENERAL:  74 y.o.-year-old patient lying in the bed with no acute distress.  EYES: Pupils equal, round, reactive to light and accommodation. No scleral icterus. Extraocular muscles intact.  HEENT: Head atraumatic, normocephalic. Oropharynx and nasopharynx clear.  NECK:  Supple, no jugular venous distention. No thyroid enlargement, no tenderness.  LUNGS: Normal breath sounds bilaterally, no wheezing, bilateral basilar rales. No use of accessory muscles of respiration.  CARDIOVASCULAR: S1, S2 normal. No murmurs, rubs, or gallops.  ABDOMEN: Soft, nontender, nondistended. Bowel sounds present. No organomegaly or mass.  EXTREMITIES: Trace edema on bilateral lower extremity, no cyanosis, or clubbing.  NEUROLOGIC: Cranial nerves II through XII are intact. Muscle strength 5/5 in all extremities. Sensation intact. Gait not checked.  PSYCHIATRIC: The patient is alert and oriented x 2.  SKIN: No obvious rash, lesion, or ulcer.   LABORATORY PANEL:   CBC  Recent Labs Lab 04/08/15 0156  WBC 9.5  HGB 10.9*  HCT 32.2*  PLT 272    ------------------------------------------------------------------------------------------------------------------  Chemistries   Recent Labs Lab 04/08/15 0156  NA 137  K 3.9  CL 101  CO2 27  GLUCOSE 203*  BUN 15  CREATININE 0.66  CALCIUM 9.0  AST 32  ALT 29  ALKPHOS 79  BILITOT 0.5   ------------------------------------------------------------------------------------------------------------------  Cardiac Enzymes  Recent Labs Lab 04/08/15 0156  TROPONINI 0.49*   ------------------------------------------------------------------------------------------------------------------  RADIOLOGY:  Dg Chest 2 View  04/08/2015   CLINICAL DATA:  Status post fall, with right anterior and lateral chest pain. Initial encounter.  EXAM: CHEST  2 VIEW  COMPARISON:  None.  FINDINGS: The lungs are well-aerated. Vascular congestion is noted, with increased interstitial markings, concerning for mild interstitial edema. There is no evidence of pleural effusion or pneumothorax.  The heart is mildly enlarged. No acute osseous abnormalities are seen.  IMPRESSION: Vascular congestion and mild cardiomegaly, with increased interstitial markings, concerning for mild interstitial edema.   Electronically Signed   By: Roanna Raider M.D.   On: 04/08/2015 01:01    EKG:  No orders found for this or any previous visit.  IMPRESSION AND PLAN:   Acute CHF Elevated troponin with possibility of NSTEMI Hypertension Diabetes Anemia  The patient will be admitted to medical floor with telemetry monitor. We'll start CHF protocol, start Lasix 20 mg IV twice a day, get echocardiogram and a cardiology consult. For elevated troponin with the possibility of non-STEMI, follow-up troponin level, start heparin drip, start lisinopril and Coreg, give Lipitor and get cardiology consult. The patient is allergic to aspirin. For diabetes, I will start sliding scale. Fall precautions.   All the records are reviewed and  case discussed with ED provider. Management plans discussed with the patient, the patient's daughter and they are in agreement. The patient is from [place] and does not speak [place]. All the information is interpretered by her daughter. We don't have a Burmese interpreter at this time.   CODE STATUS: Full code  TOTAL TIME TAKING CARE OF THIS PATIENT: 62 minutes.    Shaune Pollack M.D on 04/08/2015 at 4:33 AM  Between 7am to 6pm - Pager - [phone]  After 6pm go to [email or web address] - password [email or web address] [facility]  [place]  [facility]ospital[facility]  Office  [phone]  CC: Primary care physician; No primary care provider on file.

## 2015-04-08 NOTE — Progress Notes (Signed)
Nurse educated patient about having an interpreter, patient/family denied.

## 2015-04-09 ENCOUNTER — Ambulatory Visit (HOSPITAL_COMMUNITY)
Admission: AD | Admit: 2015-04-09 | Discharge: 2015-04-09 | Disposition: A | Discharge status: Home / Self Care | Payer: Medicare Other | Source: Other Acute Inpatient Hospital | Attending: Internal Medicine | Admitting: Internal Medicine

## 2015-04-09 ENCOUNTER — Encounter: Payer: Self-pay | Admitting: Cardiovascular Disease

## 2015-04-09 ENCOUNTER — Encounter
Admission: EM | Disposition: A | Discharge status: Other Acute Inpatient Hospital | Payer: Self-pay | Source: Home / Self Care | Attending: Internal Medicine

## 2015-04-09 DIAGNOSIS — I2510 Atherosclerotic heart disease of native coronary artery without angina pectoris: Secondary | ICD-10-CM | POA: Insufficient documentation

## 2015-04-09 DIAGNOSIS — R079 Chest pain, unspecified: Secondary | ICD-10-CM | POA: Insufficient documentation

## 2015-04-09 HISTORY — PX: CARDIAC CATHETERIZATION: SHX172

## 2015-04-09 LAB — CBC
HCT: 32 % — ABNORMAL LOW (ref 35.0–47.0)
HEMOGLOBIN: 11 g/dL — AB (ref 12.0–16.0)
MCH: 30.8 pg (ref 26.0–34.0)
MCHC: 34.4 g/dL (ref 32.0–36.0)
MCV: 89.5 fL (ref 80.0–100.0)
Platelets: 271 10*3/uL (ref 150–440)
RBC: 3.58 MIL/uL — AB (ref 3.80–5.20)
RDW: 14.6 % — AB (ref 11.5–14.5)
WBC: 8.3 10*3/uL (ref 3.6–11.0)

## 2015-04-09 LAB — GLUCOSE, CAPILLARY: Glucose-Capillary: 204 mg/dL — ABNORMAL HIGH (ref 65–99)

## 2015-04-09 LAB — HEPARIN LEVEL (UNFRACTIONATED): HEPARIN UNFRACTIONATED: 0.5 [IU]/mL (ref 0.30–0.70)

## 2015-04-09 SURGERY — LEFT HEART CATH AND CORONARY ANGIOGRAPHY
Anesthesia: Moderate Sedation | Laterality: Left

## 2015-04-09 MED ORDER — IOHEXOL 300 MG/ML  SOLN
INTRAMUSCULAR | Status: DC | PRN
  Administered 2015-04-09: 90 mL via INTRA_ARTERIAL

## 2015-04-09 MED ORDER — MIDAZOLAM HCL 2 MG/2ML IJ SOLN
INTRAMUSCULAR | Status: DC | PRN
  Administered 2015-04-09: 1 mg via INTRAVENOUS

## 2015-04-09 MED ORDER — LISINOPRIL 10 MG PO TABS: 10.0000 mg | ORAL_TABLET | Freq: Every day | ORAL | Status: AC

## 2015-04-09 MED ORDER — SODIUM CHLORIDE 0.9 % WEIGHT BASED INFUSION: 1.0000 mL/kg/h | INTRAVENOUS | Status: AC

## 2015-04-09 MED ORDER — FENTANYL CITRATE (PF) 100 MCG/2ML IJ SOLN
INTRAMUSCULAR | Status: DC | PRN
  Administered 2015-04-09: 50 ug via INTRAVENOUS

## 2015-04-09 MED ORDER — FENTANYL CITRATE (PF) 100 MCG/2ML IJ SOLN
INTRAMUSCULAR | Status: AC
  Filled 2015-04-09: qty 2

## 2015-04-09 MED ORDER — CARVEDILOL 6.25 MG PO TABS: 6.2500 mg | ORAL_TABLET | Freq: Two times a day (BID) | ORAL | Status: AC

## 2015-04-09 MED ORDER — SODIUM CHLORIDE 0.9 % IJ SOLN: 3.0000 mL | INTRAMUSCULAR | Status: DC | PRN

## 2015-04-09 MED ORDER — ONDANSETRON HCL 4 MG/2ML IJ SOLN: 4.0000 mg | Freq: Four times a day (QID) | INTRAMUSCULAR | Status: DC | PRN

## 2015-04-09 MED ORDER — ACETAMINOPHEN 325 MG PO TABS: 650.0000 mg | ORAL_TABLET | ORAL | Status: DC | PRN

## 2015-04-09 MED ORDER — HEPARIN (PORCINE) IN NACL 2-0.9 UNIT/ML-% IJ SOLN
INTRAMUSCULAR | Status: AC
  Filled 2015-04-09: qty 1000

## 2015-04-09 MED ORDER — LIDOCAINE HCL (PF) 1 % IJ SOLN
INTRAMUSCULAR | Status: DC | PRN
  Administered 2015-04-09: 10 mL

## 2015-04-09 MED ORDER — ATORVASTATIN CALCIUM 40 MG PO TABS: 40.0000 mg | ORAL_TABLET | Freq: Every day | ORAL | Status: AC

## 2015-04-09 MED ORDER — MIDAZOLAM HCL 2 MG/2ML IJ SOLN
INTRAMUSCULAR | Status: AC
  Filled 2015-04-09: qty 2

## 2015-04-09 MED ORDER — SODIUM CHLORIDE 0.9 % IJ SOLN
3.0000 mL | Freq: Two times a day (BID) | INTRAMUSCULAR | Status: DC
  Administered 2015-04-09: 3 mL via INTRAVENOUS

## 2015-04-09 MED ORDER — SODIUM CHLORIDE 0.9 % IV SOLN: 250.0000 mL | INTRAVENOUS | Status: DC | PRN

## 2015-04-09 SURGICAL SUPPLY — 9 items
CATH INFINITI 5FR ANG PIGTAIL (CATHETERS) ×3 IMPLANT
CATH INFINITI 5FR JL4 (CATHETERS) ×3 IMPLANT
CATH INFINITI JR4 5F (CATHETERS) ×3 IMPLANT
DEVICE CLOSURE MYNXGRIP 5F (Vascular Products) ×3 IMPLANT
KIT MANI 3VAL PERCEP (MISCELLANEOUS) ×3 IMPLANT
NEEDLE PERC 18GX7CM (NEEDLE) ×3 IMPLANT
PACK CARDIAC CATH (CUSTOM PROCEDURE TRAY) ×3 IMPLANT
SHEATH PINNACLE 5F 10CM (SHEATH) ×3 IMPLANT
WIRE EMERALD 3MM-J .035X150CM (WIRE) ×3 IMPLANT

## 2015-04-09 NOTE — Progress Notes (Signed)
Patient was transfer to [facility] as per order, family was imformed of transfer per MD, report called to receiving nurse, patient was transfer via care links to [facility] medical center.

## 2015-04-09 NOTE — Progress Notes (Signed)
ANTICOAGULATION CONSULT NOTE - Follow Up Consult  Pharmacy Consult for Heparin Indication: chest pain/ACS  Allergies  Allergen Reactions  . Aspirin Diarrhea    Patient Measurements: Height:  (144.8 cm) Weight: 96 lb 11.2 oz (43.863 kg) IBW/kg (Calculated) : 38.6 Heparin Dosing Weight: 43.9 kg  Vital Signs: Temp: 98.2 F (36.8 C) (08/07 2022) Temp Source: Oral (08/07 2022) BP: 114/73 mmHg (08/07 2022) Pulse Rate: 92 (08/07 2022)  Labs:  Recent Labs  04/08/15 0156 04/08/15 0525 04/08/15 1350 04/08/15 2336  HGB 10.9* 10.3*  --  10.4*  HCT 32.2* 30.1*  --  30.0*  PLT 272 261  --  254  APTT 32  --   --   --   LABPROT 13.3  --   --   --   INR 0.99  --   --   --   HEPARINUNFRC  --   --  0.18* 0.44  CREATININE 0.66 0.72  --  0.80  TROPONINI 0.49* 0.48* 0.45*  --     Estimated Creatinine Clearance: 37.6 mL/min (by C-G formula based on Cr of 0.8).   Medications:  Scheduled:  . aspirin  81 mg Oral Pre-Cath  . atorvastatin  40 mg Oral q1800  . carvedilol  6.25 mg Oral BID WC  . furosemide  20 mg Intravenous Q12H  . insulin aspart  0-5 Units Subcutaneous QHS  . insulin aspart  0-9 Units Subcutaneous TID WC  . lisinopril  10 mg Oral Daily  . pneumococcal 13-valent conjugate vaccine  0.5 mL Intramuscular Tomorrow-1000  . sodium chloride  3 mL Intravenous Q12H  . sodium chloride  3 mL Intravenous Q12H   Infusions:  . sodium chloride     Followed by  . sodium chloride    . heparin 800 Units/hr (04/08/15 1515)   PRN: sodium chloride, sodium chloride, acetaminophen **OR** acetaminophen, albuterol, HYDROcodone-acetaminophen, morphine injection, ondansetron **OR** ondansetron (ZOFRAN) IV, sodium chloride, sodium chloride  Assessment: 74 y/o F with admitted with acute CHF and elevated troponin, r/o NSTEMI. Per RN, patient's daughter reports no anticoagulants PTA.   Goal of Therapy:  Heparin level 0.3-0.7 units/ml Monitor platelets by anticoagulation protocol:  Yes   Plan:  Heparin level is at goal so will continue heparin drip at 800 units/hr and check a confirmatory level in 8 hours.   Luisa Hart D 04/09/2015,12:12 AM

## 2015-04-09 NOTE — Discharge Summary (Signed)
[facility] - [place] at [facility]   PATIENT NAME: Janice Winters    MR#:  [id number]  DATE OF BIRTH:  Jan 27, 1941  DATE OF ADMISSION:  04/07/2015 ADMITTING PHYSICIAN: Shaune Pollack, MD  DATE OF DISCHARGE: 04/09/2015   PRIMARY CARE PHYSICIAN: Kelton Pillar [facility])   ADMISSION DIAGNOSIS:  Elevated troponin [R79.89] Chest pain, unspecified chest pain type [R07.9]  DISCHARGE DIAGNOSIS:  Active Problems:   Acute CHF  severe three-vessel coronary disease SECONDARY DIAGNOSIS:   Past Medical History  Diagnosis Date  . Hypertension   . Diabetes mellitus without complication   . High cholesterol    HOSPITAL COURSE:  74 y.o. female with a known history of hypertension, diabetes, hyperlipidemia and cellulitis was admitted for possible acute CHF with elevated troponin.  Please see Dr. Nicky Pugh dictated history and physical for further details.  Patient was evaluated by cardiology who recommended cardiac catheter, which was performed on 8 of August showing severe three-vessel disease with normal LVEF.  Dr. Welton Flakes advised CABG and patient along with family is in agreement for which she is being transferred to [facility] under care of Dr. Romona Curls. DISCHARGE CONDITIONS:  Stable  CONSULTS OBTAINED:  Treatment Team:  Laurier Nancy, MD  DRUG ALLERGIES:   Allergies  Allergen Reactions  . Aspirin Diarrhea   DISCHARGE MEDICATIONS:   Current Discharge Medication List    START taking these medications   Details  atorvastatin (LIPITOR) 40 MG tablet Take 1 tablet (40 mg total) by mouth daily at 6 PM. Qty: 30 tablet, Refills: 0    carvedilol (COREG) 6.25 MG tablet Take 1 tablet (6.25 mg total) by mouth 2 (two) times daily with a meal. Qty: 60 tablet, Refills: 0    lisinopril (PRINIVIL,ZESTRIL) 10 MG tablet Take 1 tablet (10 mg total) by mouth daily. Qty: 30 tablet, Refills: 0       DISCHARGE INSTRUCTIONS:   DIET:  Cardiac  diet DISCHARGE CONDITION:  Good ACTIVITY:  Activity as tolerated OXYGEN:  Home Oxygen: No.  Oxygen Delivery: room air DISCHARGE LOCATION:  [facility]   If you experience worsening of your admission symptoms, develop shortness of breath, life threatening emergency, suicidal or homicidal thoughts you must seek medical attention immediately by calling 911 or calling your MD immediately  if symptoms less severe.  You Must read complete instructions/literature along with all the possible adverse reactions/side effects for all the Medicines you take and that have been prescribed to you. Take any new Medicines after you have completely understood and accpet all the possible adverse reactions/side effects.   Please note  You were cared for by a hospitalist during your hospital stay. If you have any questions about your discharge medications or the care you received while you were in the hospital after you are discharged, you can call the unit and asked to speak with the hospitalist on call if the hospitalist that took care of you is not available. Once you are discharged, your primary care physician will handle any further medical issues. Please note that NO REFILLS for any discharge medications will be authorized once you are discharged, as it is imperative that you return to your primary care physician (or establish a relationship with a primary care physician if you do not have one) for your aftercare needs so that they can reassess your need for medications and monitor your lab values.    On the day of Discharge:  VITAL SIGNS:  Blood pressure 158/79, pulse 91, temperature 97.8 F (36.6 C), temperature source Oral, resp. rate 17, height  (1.448 m), weight 42.638 kg (94 lb), SpO2 95 %. I/O:   Intake/Output Summary (Last 24 hours) at 04/09/15 1553 Last data filed at 04/09/15 0724  Gross per 24 hour  Intake    320 ml  Output    200 ml  Net    120 ml    PHYSICAL  EXAMINATION:  GENERAL:  74 y.o.-year-old patient lying in the bed with no acute distress.  EYES: Pupils equal, round, reactive to light and accommodation. No scleral icterus. Extraocular muscles intact.  HEENT: Head atraumatic, normocephalic. Oropharynx and nasopharynx clear.  NECK:  Supple, no jugular venous distention. No thyroid enlargement, no tenderness.  LUNGS: Normal breath sounds bilaterally, no wheezing, rales,rhonchi or crepitation. No use of accessory muscles of respiration.  CARDIOVASCULAR: S1, S2 normal. No murmurs, rubs, or gallops.  ABDOMEN: Soft, non-tender, non-distended. Bowel sounds present. No organomegaly or mass.  EXTREMITIES: No pedal edema, cyanosis, or clubbing.  NEUROLOGIC: Cranial nerves II through XII are intact. Muscle strength 5/5 in all extremities. Sensation intact. Gait not checked.  PSYCHIATRIC: The patient is alert and oriented x 3.  SKIN: No obvious rash, lesion, or ulcer.  DATA REVIEW:   CBC  Recent Labs Lab 04/09/15 0737  WBC 8.3  HGB 11.0*  HCT 32.0*  PLT 271    Chemistries   Recent Labs Lab 04/08/15 0156  04/08/15 2336  NA 137  < > 134*  K 3.9  < > 4.0  CL 101  < > 97*  CO2 27  < > 28  GLUCOSE 203*  < > 206*  BUN 15  < > 20  CREATININE 0.66  < > 0.80  CALCIUM 9.0  < > 8.8*  AST 32  --   --   ALT 29  --   --   ALKPHOS 79  --   --   BILITOT 0.5  --   --   < > = values in this interval not displayed.  Cardiac Enzymes  Recent Labs Lab 04/08/15 1350  TROPONINI 0.45*    RADIOLOGY:  Dg Chest 2 View  04/08/2015   CLINICAL DATA:  Status post fall, with right anterior and lateral chest pain. Initial encounter.  EXAM: CHEST  2 VIEW  COMPARISON:  None.  FINDINGS: The lungs are well-aerated. Vascular congestion is noted, with increased interstitial markings, concerning for mild interstitial edema. There is no evidence of pleural effusion or pneumothorax.  The heart is mildly enlarged. No acute osseous abnormalities are seen.   IMPRESSION: Vascular congestion and mild cardiomegaly, with increased interstitial markings, concerning for mild interstitial edema.   Electronically Signed   By: Roanna Raider M.D.   On: 04/08/2015 01:01   Management plans discussed with the patient, family and they are in agreement.  CODE STATUS: Full code  TOTAL TIME TAKING CARE OF THIS PATIENT: 55 minutes.    [facility], Lael Pilch M.D on 04/09/2015 at 3:53 PM  Between 7am to 6pm - Pager - [phone]  After 6pm go to [email or web address] - password [email or web address] [facility]  [place] Hospital[facility]  Office  [phone]  CC: Primary care physician; [facility] Laurier Nancy, MD Dr Romona Curls - [facility] cardiothoracic surgery

## 2015-04-09 NOTE — Progress Notes (Signed)
SUBJECTIVE: Patient is feeling much better this morning   Filed Vitals:   04/08/15 1130 04/08/15 2022 04/09/15 0507 04/09/15 0854  BP: 129/59 114/73 110/65 157/75  Pulse: 92 92 97 93  Temp: 98.5 F (36.9 C) 98.2 F (36.8 C) 98.9 F (37.2 C)   TempSrc: Oral Oral Oral   Resp: Height:     (1.448 m)  Weight:   43.046 kg (94 lb 14.4 oz) 42.638 kg (94 lb)  SpO2: 91% 87% 94% 93%    Intake/Output Summary (Last 24 hours) at 04/09/15 1010 Last data filed at 04/09/15 0724  Gross per 24 hour  Intake  393.5 ml  Output    200 ml  Net  193.5 ml    LABS: Basic Metabolic Panel:  Recent Labs  16/10/96 0525 04/08/15 2336  NA 138 134*  K 4.0 4.0  CL 104 97*  CO2 27 28  GLUCOSE 183* 206*  BUN 15 20  CREATININE 0.72 0.80  CALCIUM 8.8* 8.8*   Liver Function Tests:  Recent Labs  04/08/15 0156  AST 32  ALT 29  ALKPHOS 79  BILITOT 0.5  PROT 6.7  ALBUMIN 3.4*   No results for input(s): LIPASE, AMYLASE in the last 72 hours. CBC:  Recent Labs  04/08/15 2336 04/09/15 0737  WBC 8.4 8.3  HGB 10.4* 11.0*  HCT 30.0* 32.0*  MCV 89.7 89.5  PLT 254 271   Cardiac Enzymes:  Recent Labs  04/08/15 0156 04/08/15 0525 04/08/15 1350  TROPONINI 0.49* 0.48* 0.45*   BNP: Invalid input(s): POCBNP D-Dimer: No results for input(s): DDIMER in the last 72 hours. Hemoglobin A1C:  Recent Labs  04/08/15 0156  HGBA1C 7.2*   Fasting Lipid Panel: No results for input(s): CHOL, HDL, LDLCALC, TRIG, CHOLHDL, LDLDIRECT in the last 72 hours. Thyroid Function Tests: No results for input(s): TSH, T4TOTAL, T3FREE, THYROIDAB in the last 72 hours.  Invalid input(s): FREET3 Anemia Panel: No results for input(s): VITAMINB12, FOLATE, FERRITIN, TIBC, IRON, RETICCTPCT in the last 72 hours.   PHYSICAL EXAM General: Well developed, well nourished, in no acute distress HEENT:  Normocephalic and atramatic Neck:  No JVD.  Lungs: Clear bilaterally to auscultation and  percussion. Heart: HRRR . Normal S1 and S2 without gallops or murmurs.  Abdomen: Bowel sounds are positive, abdomen soft and non-tender  Msk:  Back normal, normal gait. Normal strength and tone for age. Extremities: No clubbing, cyanosis or edema.   Neuro: Alert and oriented X 3. 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 Patient has elevated troponin and will have cardiac catheterization done today. Patient is feeling better much better compared to yesterday.  Active Problems:   Acute CHF    KHAN,SHAUKAT A, MD, [facility] 04/09/2015 10:10 AM

## 2015-04-09 NOTE — Progress Notes (Addendum)
ANTICOAGULATION CONSULT NOTE - Follow Up Consult  Pharmacy Consult for Heparin Indication: chest pain/ACS  Allergies  Allergen Reactions  . Aspirin Diarrhea    Patient Measurements: Height:  (144.8 cm) Weight: 94 lb 14.4 oz (43.046 kg) IBW/kg (Calculated) : 38.6 Heparin Dosing Weight: 43 kg  Vital Signs: Temp: 98.9 F (37.2 C) (08/08 0507) Temp Source: Oral (08/08 0507) BP: 110/65 mmHg (08/08 0507) Pulse Rate: 97 (08/08 0507)  Labs:  Recent Labs  04/08/15 0156 04/08/15 0525 04/08/15 1350 04/08/15 2336 04/09/15 0737  HGB 10.9* 10.3*  --  10.4* 11.0*  HCT 32.2* 30.1*  --  30.0* 32.0*  PLT 272 261  --  254 271  APTT 32  --   --   --   --   LABPROT 13.3  --   --   --   --   INR 0.99  --   --   --   --   HEPARINUNFRC  --   --  0.18* 0.44 0.50  CREATININE 0.66 0.72  --  0.80  --   TROPONINI 0.49* 0.48* 0.45*  --   --     Estimated Creatinine Clearance: 37.6 mL/min (by C-G formula based on Cr of 0.8).   Medications:  Scheduled:  . atorvastatin  40 mg Oral q1800  . carvedilol  6.25 mg Oral BID WC  . furosemide  20 mg Intravenous Q12H  . insulin aspart  0-5 Units Subcutaneous QHS  . insulin aspart  0-9 Units Subcutaneous TID WC  . lisinopril  10 mg Oral Daily  . pneumococcal 13-valent conjugate vaccine  0.5 mL Intramuscular Tomorrow-1000  . sodium chloride  3 mL Intravenous Q12H  . sodium chloride  3 mL Intravenous Q12H   Infusions:  . sodium chloride    . heparin 800 Units/hr (04/09/15 0600)   PRN: sodium chloride, sodium chloride, acetaminophen **OR** acetaminophen, albuterol, HYDROcodone-acetaminophen, morphine injection, ondansetron **OR** ondansetron (ZOFRAN) IV, sodium chloride, sodium chloride  Assessment: 74 y/o F with admitted with acute CHF and elevated troponin, r/o NSTEMI. Per RN, patient's daughter reports no anticoagulants PTA.   Goal of Therapy:  Heparin level 0.3-0.7 units/ml Monitor platelets by anticoagulation protocol: Yes   Plan:   Repeat Heparin level = 0.50 is at goal so will continue heparin drip at 800 units/hr and check next level in AM with CBC. Hgb/Plt stable (Hgb 11.0, Plt 271).  Marty Heck 04/09/2015,8:13 AM

## 2015-04-09 NOTE — Discharge Instructions (Signed)
Acute Coronary Syndrome  Acute coronary syndrome (ACS) is an urgent problem in which the blood and oxygen supply to the heart is critically deficient. ACS requires hospitalization because one or more coronary arteries may be blocked.  ACS represents a range of conditions including:  · Previous angina that is now unstable, lasts longer, happens at rest, or is more intense.  · A heart attack, with heart muscle cell injury and death.  There are three vital coronary arteries that supply the heart muscle with blood and oxygen so that it can pump blood effectively. If blockages to these arteries develop, blood flow to the heart muscle is reduced. If the heart does not get enough blood, angina may occur as the first warning sign.  SYMPTOMS   · The most common signs of angina include:  ¨ Tightness or squeezing in the chest.  ¨ Feeling of heaviness on the chest.  ¨ Discomfort in the arms, neck, back, or jaw.  ¨ Shortness of breath and nausea.  ¨ Cold, wet skin.  · Angina is usually brought on by physical effort or excitement which increase the oxygen needs of the heart. These states increase the blood flow needs of the heart beyond what can be delivered.  · Other symptoms that are not as common include:  ¨ Fatigue  ¨ Unexplained feelings of nervousness or anxiety  ¨ Weakness  ¨ Diarrhea  · Sometimes, you may not have noticed any symptoms at all but still suffered a cardiac injury.  TREATMENT   · Medicines to help discomfort may include nitroglycerin (nitro) in the form of tablets or a spray for rapid relief, or longer-acting forms such as cream, patches, or capsules. (Be aware that there are many side effects and possible interactions with other drugs).  · Other medicines may be used to help the heart pump better.  · Procedures to open blocked arteries including angioplasty or stent placement to keep the arteries open.  · Open heart surgery may be needed when there are many blockages or they are in critical locations that  are best treated with surgery.  HOME CARE INSTRUCTIONS   · Do not use any tobacco products including cigarettes, chewing tobacco, or electronic cigarettes.  · Take one baby or adult aspirin daily, if your health care provider advises. This helps reduce the risk of a heart attack.  · It is very important that you follow the angina treatment prescribed by your health care provider. Make arrangements for proper follow-up care.  · Eat a heart healthy diet with salt and fat restrictions as advised.  · Regular exercise is good for you as long as it does not cause discomfort. Do not begin any new type of exercise until you check with your health care provider.  · If you are overweight, you should lose weight.  · Try to maintain normal blood lipid levels.  · Keep your blood pressure under control as recommended by your health care provider.  · You should tell your health care provider right away about any increase in the severity or frequency of your chest discomfort or angina attacks. When you have angina, you should stop what you are doing and sit down. This may bring relief in 3 to 5 minutes. If your health care provider has prescribed nitro, take it as directed.  · If your health care provider has given you a follow-up appointment, it is very important to keep that appointment. Not keeping the appointment could result in a chronic or   permanent injury, pain, and disability. If there is any problem keeping the appointment, you must call back to this facility for assistance.  SEEK IMMEDIATE MEDICAL CARE IF:   · You develop nausea, vomiting, or shortness of breath.  · You feel faint, lightheaded, or pass out.  · Your chest discomfort gets worse.  · You are sweating or experience sudden profound fatigue.  · You do not get relief of your chest pain after 3 doses of nitro.  · Your discomfort lasts longer than 15 minutes.  MAKE SURE YOU:   · Understand these instructions.  · Will watch your condition.  · Will get help right  away if you are not doing well or get worse.  · Take all medicines as directed by your health care provider.  Document Released: 08/18/2005 Document Revised: 08/23/2013 Document Reviewed: 12/20/2013  [employer]® Patient Information ©2015 [employer]. This information is not intended to replace advice given to you by your health care provider. Make sure you discuss any questions you have with your health care provider.

## 2015-04-09 NOTE — Progress Notes (Signed)
Initial Nutrition Assessment   INTERVENTION:   Meals and Snacks: Cater to patient preferences Medical Food Supplement Therapy: will recommend on follow if intake poor on follow   NUTRITION DIAGNOSIS:   Inadequate oral intake related to inability to eat as evidenced by NPO status.  GOAL:   Patient will meet greater than or equal to 90% of their needs  MONITOR:    (Energy Intake, Anthropometrics, Electrolyte and renal Profile)  REASON FOR ASSESSMENT:   Diagnosis    ASSESSMENT:   Pt admitted with acute congestive heart failure. Pt scheduled for cardiac cath today.  Past Medical History  Diagnosis Date  . Hypertension   . Diabetes mellitus without complication   . High cholesterol     Diet Order:  Diet NPO time specified    Current Nutrition: Recorded po intake 90-100% of meals.  Food/Nutrition-Related History: Per [facility] pt without decreased appetite PTA.   Medications: Lasix, Novolog, NS injection, heparin  Electrolyte/Renal Profile and Glucose Profile:   Recent Labs Lab 04/08/15 0156 04/08/15 0525 04/08/15 2336  NA 137 138 134*  K 3.9 4.0 4.0  CL 101 104 97*  CO2 BUN CREATININE 0.66 0.72 0.80  CALCIUM 9.0 8.8* 8.8*  GLUCOSE 203* 183* 206*   Protein Profile:   Recent Labs Lab 04/08/15 0156  ALBUMIN 3.4*    Gastrointestinal Profile: Last BM:  04/07/2015   Nutrition-Focused Physical Exam Findings:  Unable to complete Nutrition-Focused physical exam at this time.    Weight Change: Per MD note no changes in weight PTA   Skin:  Reviewed, no issues Height:   Ht Readings from Last 1 Encounters:  04/09/15  (1.448 m)    Weight:   Wt Readings from Last 1 Encounters:  04/09/15 94 lb (42.638 kg)    BMI:  Body mass index is 20.34 kg/(m^2).  Estimated Nutritional Needs:   Kcal:  1189-1405kcals, BEE: 832kcals, TEE: (IF 1.1-1.3)(AF 1.3)  Protein:  43-51g protein (1.0-1.2g/kg)  Fluid:  1065-1227mL of fluid  (25-55mL/kg)  EDUCATION NEEDS:   Education needs no appropriate at this time   MODERATE Care Level  Leda Quail, RD, LDN Pager [phone]

## 2016-12-18 ENCOUNTER — Other Ambulatory Visit: Payer: Self-pay | Admitting: Surgery

## 2016-12-18 ENCOUNTER — Ambulatory Visit
Admission: RE | Admit: 2016-12-18 | Discharge: 2016-12-18 | Disposition: A | Discharge status: Home / Self Care | Payer: Medicare (Managed Care) | Source: Ambulatory Visit | Attending: Surgery | Admitting: Surgery

## 2016-12-18 ENCOUNTER — Encounter: Payer: Medicare (Managed Care) | Attending: Surgery | Admitting: Surgery

## 2016-12-18 DIAGNOSIS — I110 Hypertensive heart disease with heart failure: Secondary | ICD-10-CM | POA: Insufficient documentation

## 2016-12-18 DIAGNOSIS — B999 Unspecified infectious disease: Secondary | ICD-10-CM

## 2016-12-18 DIAGNOSIS — K219 Gastro-esophageal reflux disease without esophagitis: Secondary | ICD-10-CM | POA: Diagnosis not present

## 2016-12-18 DIAGNOSIS — E785 Hyperlipidemia, unspecified: Secondary | ICD-10-CM | POA: Diagnosis not present

## 2016-12-18 DIAGNOSIS — E1121 Type 2 diabetes mellitus with diabetic nephropathy: Secondary | ICD-10-CM | POA: Insufficient documentation

## 2016-12-18 DIAGNOSIS — E11621 Type 2 diabetes mellitus with foot ulcer: Secondary | ICD-10-CM | POA: Insufficient documentation

## 2016-12-18 DIAGNOSIS — Z794 Long term (current) use of insulin: Secondary | ICD-10-CM | POA: Diagnosis not present

## 2016-12-18 DIAGNOSIS — I5032 Chronic diastolic (congestive) heart failure: Secondary | ICD-10-CM | POA: Diagnosis not present

## 2016-12-18 DIAGNOSIS — L97512 Non-pressure chronic ulcer of other part of right foot with fat layer exposed: Secondary | ICD-10-CM | POA: Diagnosis not present

## 2016-12-18 DIAGNOSIS — Z885 Allergy status to narcotic agent status: Secondary | ICD-10-CM | POA: Insufficient documentation

## 2016-12-18 DIAGNOSIS — I739 Peripheral vascular disease, unspecified: Secondary | ICD-10-CM | POA: Insufficient documentation

## 2016-12-18 DIAGNOSIS — E1140 Type 2 diabetes mellitus with diabetic neuropathy, unspecified: Secondary | ICD-10-CM | POA: Diagnosis not present

## 2016-12-18 DIAGNOSIS — M7989 Other specified soft tissue disorders: Secondary | ICD-10-CM | POA: Insufficient documentation

## 2016-12-18 DIAGNOSIS — L089 Local infection of the skin and subcutaneous tissue, unspecified: Secondary | ICD-10-CM | POA: Insufficient documentation

## 2016-12-19 NOTE — Progress Notes (Addendum)
MIU, CHIONG ([id number]) Visit Report for 12/18/2016 Chief Complaint Document Details Patient Name: Janice Winters, Janice Winters 12/18/2016 10:30 Date of Service: AM Medical Record [id number] Number: Patient Account Number: [id number] [birth year] (76 y.o. Treating RN: Phillis Haggis Date of Birth/Sex: Female) Other Clinician: Primary Care Provider: Durel Salts Treating Alesi Zachery Referring Provider: Durel Salts Provider/Extender: Tania Ade in Treatment: 0 Information Obtained from: Patient Chief Complaint Patients presents for treatment of an open diabetic ulcer for about 3 months on her right big toe. She is from [place] and has been spoken to via a interpreter on the phone line and her daughter who speaks good Metallurgist) Signed: 12/18/2016 11:27:52 AM By: Evlyn Kanner MD, FACS Entered By: Evlyn Kanner on 12/18/2016 11:27:51 Uresti, Leshonda ([id number]) -------------------------------------------------------------------------------- Debridement Details Patient Name: [name], [name] 12/18/2016 10:30 Date of Service: AM Medical Record [id number] Number: Patient Account Number: [id number] Aug 23, 1941 (75 y.o. Treating RN: Phillis Haggis Date of Birth/Sex: Female) Other Clinician: Primary Care Provider: Durel Salts Treating Makaylin Carlo Referring Provider: Durel Salts Provider/Extender: Tania Ade in Treatment: 0 Debridement Performed for Wound #1 Right,Plantar Toe Great Assessment: Performed By: Physician Evlyn Kanner, MD Debridement: Debridement Pre-procedure Yes - 11:10 Verification/Time Out Taken: Start Time: 11:11 Pain Control: Lidocaine 4% Topical Solution Level: Skin/Subcutaneous Tissue Total Area Debrided (L x 2.7 (cm) x 1 (cm) = 2.7 (cm) W): Tissue and other Exudate, Fibrin/Slough, Subcutaneous material debrided: Instrument: Forceps, Scissors Bleeding: Minimum Hemostasis Achieved: Pressure End Time: 11:15 Procedural Pain: 0 Post Procedural Pain: 0 Response to  Treatment: Procedure was tolerated well Post Debridement Measurements of Total Wound Length: (cm) 2.7 Width: (cm) 1 Depth: (cm) 0.1 Volume: (cm) 0.212 Character of Wound/Ulcer Post Requires Further Debridement Debridement: Severity of Tissue Post Debridement: Fat layer exposed Post Procedure Diagnosis Same as Pre-procedure Electronic Signature(s) Signed: 12/18/2016 11:27:09 AM By: Evlyn Kanner MD, FACS Signed: 12/18/2016 4:17:25 PM By: Evaristo Bury, Keyleigh ([id number]) Entered By: Evlyn Kanner on 12/18/2016 11:27:09 Munguia, Carlton ([id number]) -------------------------------------------------------------------------------- HPI Details Patient Name: Janice, Winters 12/18/2016 10:30 Date of Service: AM Medical Record [id number] Number: Patient Account Number: [id number] [birth year] (75 y.o. Treating RN: Phillis Haggis Date of Birth/Sex: Female) Other Clinician: Primary Care Provider: Durel Salts Treating Amanda Pote Referring Provider: Durel Salts Provider/Extender: Tania Ade in Treatment: 0 History of Present Illness Location: right big toe Quality: Patient reports experiencing a sharp pain to affected area(s). Severity: Patient states wound are getting worse. Duration: Patient has had the wound for > 3 months prior to seeking treatment at the wound center Timing: Pain in wound is Intermittent (comes and goes Context: The wound appeared gradually over time Modifying Factors: Other treatment(s) tried include:Santyl ointment locally Associated Signs and Symptoms: Patient reports having increase swelling. HPI Description: 76 year old lady who has come to [place] from [facility] for a right great toe diabetic foot ulcer. 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 She has never been a smoker.review of her recent lab artery  investigation done show that 3 months ago her hemoglobin A1c was 10.9% The was some old arterial duplex evaluation done in July 2016 which showed right great toe pressure of 42 mm Hg and left great toe pressure was 60 mmHg. There was obstruction involving the tibioperoneal vessels on the both the right and left side. Electronic Signature(s) Signed: 12/18/2016 11:32:23 AM By: Evlyn Kanner MD, FACS Previous Signature: 12/18/2016 10:51:20 AM Version By: Evlyn Kanner MD, FACS Previous Signature: 12/18/2016  10:32:05 AM Version By: Evlyn Kanner MD, FACS Entered By: Evlyn Kanner on 12/18/2016 11:32:23 Lyvers, Leafy Kindle ([id number]) -------------------------------------------------------------------------------- Physical Exam Details Patient Name: Janice, Winters 12/18/2016 10:30 Date of Service: AM Medical Record [id number] Number: Patient Account Number: [id number] August 04, 1941 (75 y.o. Treating RN: Phillis Haggis Date of Birth/Sex: Female) Other Clinician: Primary Care Provider: Durel Salts Treating Eleonore Shippee Referring Provider: Durel Salts Provider/Extender: Weeks in Treatment: 0 Constitutional . Pulse regular. Respirations normal and unlabored. Afebrile. . Eyes Nonicteric. Reactive to light. Ears, Nose, Mouth, and Throat Lips, teeth, and gums WNL.Marland Kitchen Moist mucosa without lesions. Neck supple and nontender. No palpable supraclavicular or cervical adenopathy. Normal sized without goiter. Respiratory WNL. No retractions.. Breath sounds WNL, No rubs, rales, rhonchi, or wheeze.. Cardiovascular Heart rhythm and rate regular, no murmur or gallop.. Pedal Pulses WNL ABI on the right was 0.5. No clubbing, cyanosis or edema.. Chest Breasts symmetical and no nipple discharge.. Breast tissue WNL, no masses, lumps, or tenderness.. Gastrointestinal (GI) Abdomen without masses or tenderness.. No liver or spleen enlargement or tenderness.. Lymphatic No adneopathy. No adenopathy. No  adenopathy. Musculoskeletal Adexa without tenderness or enlargement.. Digits and nails w/o clubbing, cyanosis, infection, petechiae, ischemia, or inflammatory conditions.. Integumentary (Hair, Skin) No suspicious lesions. No crepitus or fluctuance. No peri-wound warmth or erythema. No masses.Marland Kitchen Psychiatric Judgement and insight Intact.. No evidence of depression, anxiety, or agitation.. Notes the patient has a ulcerated area on the plantar and medial aspect of the right big toe and the base is covered with some loose necrotic debris which have sharply removed with forceps and scissors. No bleeding and no deep dissection done now was there any increase in the size of the wound by debriding the edges Electronic Signature(s) Leeper, Kynzie ([id number]) Signed: 12/18/2016 11:33:28 AM By: Evlyn Kanner MD, FACS Entered By: Evlyn Kanner on 12/18/2016 11:33:27 Beville, Lorilei ([id number]) -------------------------------------------------------------------------------- Physician Orders Details Patient Name: Kunz, Hazeline 12/18/2016 10:30 Date of Service: AM Medical Record [id number] Number: Patient Account Number: [id number] 10-21-1940 (75 y.o. Treating RN: Phillis Haggis Date of Birth/Sex: Female) Other Clinician: Primary Care Provider: Durel Salts Treating Hussam Muniz Referring Provider: Durel Salts Provider/Extender: Tania Ade in Treatment: 0 Verbal / Phone Orders: Yes ClinicianAshok Cordia, Debi Read Back and Verified: Yes Diagnosis Coding Wound Cleansing Wound #1 Right,Plantar Toe Great o Clean wound with Normal Saline. o Cleanse wound with mild soap and water o May Shower, gently pat wound dry prior to applying new dressing. Anesthetic Wound #1 Right,Plantar Toe Great o Topical Lidocaine 4% cream applied to wound bed prior to debridement - for clinic use Skin Barriers/Peri-Wound Care Wound #1 Right,Plantar Toe Great o Skin Prep Primary Wound Dressing Wound #1 Right,Plantar Toe  Great o Santyl Ointment Secondary Dressing Wound #1 Right,Plantar Toe Great o Dry Gauze o Conform/Kerlix o Other - tape Dressing Change Frequency Wound #1 Right,Plantar Toe Great o Change dressing every day. Follow-up Appointments Wound #1 Right,Plantar Toe Great o Return Appointment in 1 week. Edema Control Banka, Nahia ([id number]) Wound #1 Right,Plantar Toe Great o Elevate legs to the level of the heart and pump ankles as often as possible Off-Loading Wound #1 Right,Plantar Toe Great o Other: - peg assist shoe Additional Orders / Instructions Wound #1 Right,Plantar Toe Great o Increase protein intake. Medications-please add to medication list. Wound #1 Right,Plantar Toe Great o Santyl Enzymatic Ointment Radiology o X-ray, foot - right Services and Therapies o Arterial Studies- Bilateral Patient Medications Allergies: aspirin, oxycodone, minocycline Notifications Medication Indication Start End Santyl 12/18/2016 DOSE topical 250 unit/gram ointment -  ointment topical as directed Electronic Signature(s) Signed: 12/18/2016 4:16:12 PM By: Evlyn Kanner MD, FACS Signed: 12/18/2016 4:17:25 PM By: Alejandro Mulling Previous Signature: 12/18/2016 11:25:21 AM Version By: Evlyn Kanner MD, FACS Entered By: Alejandro Mulling on 12/18/2016 11:48:23 Gerling, Blakley ([id number]) -------------------------------------------------------------------------------- Problem List Details Patient Name: Aigner, Terricka 12/18/2016 10:30 Date of Service: AM Medical Record [id number] Number: Patient Account Number: [id number] 1941/06/05 (75 y.o. 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 Treating RN: Phillis Haggis Date of Birth/Sex: Female) Other Clinician: Primary Care Provider: Durel Salts Treating Jamarii Banks Referring Provider: Durel Salts Provider/Extender: Tania Ade in Treatment: 0 Subjective Chief Complaint Information obtained from Patient Patients presents for treatment of an open diabetic ulcer for about 3 months on her right big toe. She is from [place] and has been spoken to via a interpreter on the phone line and her daughter who speaks good English History of Present Illness (HPI) The following HPI elements were documented for the patient's wound: Location: right big toe Quality: Patient reports experiencing a sharp pain to affected area(s). Severity: Patient states wound are getting worse. Duration: Patient has had the wound for > 3 months prior to seeking treatment at the wound center Timing: Pain in wound is Intermittent (comes and goes Context: The wound appeared gradually over time Modifying Factors: Other treatment(s) tried include:Santyl ointment locally Associated Signs and Symptoms: Patient reports having increase swelling. 76 year old lady who has come to [place] from [facility] for a right great toe diabetic foot ulcer. 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 She has never been a smoker.review of her recent lab artery investigation done show that 3 months ago her hemoglobin A1c was 10.9% The was some old arterial duplex evaluation done in July 2016 which showed right great toe pressure of 42 mm Hg and left great toe pressure was 60 mmHg. There was obstruction involving the tibioperoneal vessels on the both the right and left side. Wound History Patient presents with 1 open wound that has been present for approximately 3 months. Patient has been treating wound in the following manner: unsure. Laboratory tests have not been performed in the last month. Patient reportedly has not tested positive for an antibiotic resistant organism. Patient reportedly has not tested positive for osteomyelitis. Patient reportedly has had testing performed to evaluate circulation in the legs. Patient experiences the following problems associated with their wounds: swelling. Patient History Information obtained from Patient, interpreter. Caster, Jaleyah ([id number]) Allergies aspirin, oxycodone, minocycline Social History Never smoker, Alcohol Use - Never, Drug Use - No History. Medical History Cardiovascular Patient has history of Hypertension Endocrine Patient has history of Type II Diabetes Patient is treated with Insulin, Oral Agents. Blood sugar is tested. Review of Systems (ROS) Constitutional Symptoms (  General Health) Complains or has symptoms of Chills. 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 Hematologic/Lymphatic The patient has no complaints or symptoms. Respiratory The patient has no complaints or symptoms. Cardiovascular hyperlipidemia Gastrointestinal GERD Genitourinary The patient has no complaints or symptoms. Immunological The patient has no complaints or symptoms. Integumentary  (Skin) Complains or has symptoms of Wounds. Musculoskeletal The patient has no complaints or symptoms. Neurologic The patient has no complaints or symptoms. Oncologic The patient has no complaints or symptoms. Psychiatric The patient has no complaints or symptoms. Objective Simien, Odeal ([id number]) Constitutional Pulse regular. Respirations normal and unlabored. Afebrile. Vitals Time Taken: 10:35 AM, Height: 58 in, Source: Stated, Weight: 94.3 lbs, Source: Measured, BMI: 19.7, Temperature: 97.7 F, Pulse: 64 bpm, Respiratory Rate: 16 breaths/min, Blood Pressure: 209/74 mmHg. General Notes: 218/[address] Made Dr. Meyer Russel aware of pts BP also her nurse practitioner Anna Genre) was here as well from [facility] and she is aware as well. Eyes Nonicteric. Reactive to light. Ears, Nose, Mouth, and Throat Lips, teeth, and gums WNL.Marland Kitchen Moist mucosa without lesions. Neck supple and nontender. No palpable supraclavicular or cervical adenopathy. Normal sized without goiter. Respiratory WNL. No retractions.. Breath sounds WNL, No rubs, rales, rhonchi, or wheeze.. Cardiovascular Heart rhythm and rate regular, no murmur or gallop.. Pedal Pulses WNL ABI on the right was 0.5. No clubbing, cyanosis or edema.. Chest Breasts symmetical and no nipple discharge.. Breast tissue WNL, no masses, lumps, or tenderness.. Gastrointestinal (GI) Abdomen without masses or tenderness.. No liver or spleen enlargement or tenderness.. Lymphatic No adneopathy. No adenopathy. No adenopathy. Musculoskeletal Adexa without tenderness or enlargement.. Digits and nails w/o clubbing, cyanosis, infection, petechiae, ischemia, or inflammatory conditions.Marland Kitchen Psychiatric Judgement and insight Intact.. No evidence of depression, anxiety, or agitation.. General Notes: the patient has a ulcerated area on the plantar and medial aspect of the right big toe and the base is covered with some loose necrotic debris which have sharply removed  with forceps and scissors. No bleeding and no deep dissection done now was there any increase in the size of the wound by debriding the edges Integumentary (Hair, Skin) Venard, Corina ([id number]) No suspicious lesions. No crepitus or fluctuance. No peri-wound warmth or erythema. No masses.. Wound #1 status is Open. Original cause of wound was Gradually Appeared. The wound is located on the [employer]. The wound measures 2.7cm length x 1cm width x 0.1cm depth; 2.121cm^2 area and 0.212cm^3 volume. The wound is limited to skin breakdown. There is no tunneling or undermining noted. There is a large amount of serous drainage noted. The wound margin is distinct with the outline attached to the wound base. There is no granulation within the wound bed. There is a large (67-100%) amount of necrotic tissue within the wound bed including Adherent Slough. The periwound skin appearance exhibited: Maceration. Periwound temperature was noted as No Abnormality. The periwound has tenderness on palpation. Assessment Active Problems ICD-10 E11.621 - Type 2 diabetes mellitus with foot ulcer L97.512 - Non-pressure chronic ulcer of other part of right foot with fat layer exposed I73.9 - Peripheral vascular disease, unspecified This 76 year old patient who has had chronic diabetes mellitus for several years and has been very noncompliant with the treatment also has hypertension and peripheral vascular disease. After a thorough review I have recommended: 1. Santyl ointment daily to be applied after washing with soap and water 2. Floating has been discussed in great detail and we will give her an  appropriate shoe 3. Good control of her diabetes mellitus and her hypertension 4. Appreciate diet restrictions and supplements with protein, vitamin A, vitamin C and zinc 5. X-ray of the right foot 6. Arterial duplex study of both lower extremities 7. Regular visits the wound center. The patient and her daughter  have had all questions answered via the interpreter and the nurse packed additional is in the room and she has also been briefed about the management plans Procedures Wound #1 Wound #1 is a Diabetic Wound/Ulcer of the Lower Extremity located on the Right,Plantar Toe Great . There Tierney, Uilani ([id number]) was a Skin/Subcutaneous Tissue Debridement ([id number]) debridement with total area of 2.7 sq cm performed by Evlyn Kanner, MD. with the following instrument(s): Forceps and Scissors including Exudate, Fibrin/Slough, and Subcutaneous after achieving pain control using Lidocaine 4% Topical Solution. A time out was conducted at 11:10, prior to the start of the procedure. A Minimum amount of bleeding was controlled with Pressure. The procedure was tolerated well with a pain level of 0 throughout and a pain level of 0 following the procedure. Post Debridement Measurements: 2.7cm length x 1cm width x 0.1cm depth; 0.212cm^3 volume. Character of Wound/Ulcer Post Debridement requires further debridement. Severity of Tissue Post Debridement is: Fat layer exposed. Post procedure Diagnosis Wound #1: Same as Pre-Procedure Plan Wound Cleansing: Wound #1 Right,Plantar Toe Great: Clean wound with Normal Saline. Cleanse wound with mild soap and water May Shower, gently pat wound dry prior to applying new dressing. Anesthetic: Wound #1 Right,Plantar Toe Great: Topical Lidocaine 4% cream applied to wound bed prior to debridement - for clinic use Skin Barriers/Peri-Wound Care: Wound #1 Right,Plantar Toe Great: Skin Prep Primary Wound Dressing: Wound #1 Right,Plantar Toe Great: Santyl Ointment Secondary Dressing: Wound #1 Right,Plantar Toe Great: Dry Gauze Conform/Kerlix Other - tape Dressing Change Frequency: Wound #1 Right,Plantar Toe Great: Change dressing every day. Follow-up Appointments: Wound #1 Right,Plantar Toe Great: Return Appointment in 1 week. Edema Control: Wound #1 Right,Plantar  Toe Great: Elevate legs to the level of the heart and pump ankles as often as possible Off-Loading: Wound #1 Right,Plantar Toe Great: Other: - peg assist shoe Additional Orders / Instructions: Putzier, Birda ([id number]) Wound #1 Right,Plantar Toe Great: Increase protein intake. Medications-please add to medication list.: Wound #1 Right,Plantar Toe Great: Santyl Enzymatic Ointment Radiology ordered were: X-ray, foot - right Services and Therapies ordered were: Arterial Studies- Bilateral The following medication(s) was prescribed: Santyl topical 250 unit/gram ointment ointment topical as directed starting 12/18/2016 This 76 year old patient who has had chronic diabetes mellitus for several years and has been very noncompliant with the treatment also has hypertension and peripheral vascular disease. After a thorough review I have recommended: 1. Santyl ointment daily to be applied after washing with soap and water 2. Floating has been discussed in great detail and we will give her an appropriate shoe 3. Good control of her diabetes mellitus and her hypertension 4. Appreciate diet restrictions and supplements with protein, vitamin A, vitamin C and zinc 5. X-ray of the right foot 6. Arterial duplex study of both lower extremities 7. Regular visits the wound center. The patient and her daughter have had all questions answered via the interpreter and the nurse packed additional is in the room and she has also been briefed about the management plans Electronic Signature(s) Signed: 12/18/2016 4:35:15 PM By: Evlyn Kanner MD, FACS Previous Signature: 12/18/2016 11:37:32 AM Version By: Evlyn Kanner MD, FACS Entered By: Evlyn Kanner on 12/18/2016 16:35:15 Poppen, Nikia ([id number]) -------------------------------------------------------------------------------- ROS/PFSH  Details Patient Name: LAURIAN, EDRINGTON 12/18/2016 10:30 Date of Service: AM Medical Record [id number] Number: Patient Account Number:  [id number] [birth year] (76 y.o. Treating RN: Phillis Haggis Date of Birth/Sex: Female) Other Clinician: Primary Care Provider: Durel Salts Treating Stormy Sabol Referring Provider: Durel Salts Provider/Extender: Tania Ade in Treatment: 0 Information Obtained From Patient Other: interpreter Wound History Do you currently have one or more open woundso Yes How many open wounds do you currently haveo 1 Approximately how long have you had your woundso 3 months How have you been treating your wound(s) until nowo unsure Has your wound(s) ever healed and then re-openedo No Have you had any lab work done in the past montho No Have you tested positive for an antibiotic resistant organism (MRSA, VRE)o No Have you tested positive for osteomyelitis (bone infection)o No Have you had any tests for circulation on your legso Yes Who ordered the testo duke Have you had other problems associated with your woundso Swelling Constitutional Symptoms (General Health) Complaints and Symptoms: Positive for: Chills Integumentary (Skin) Complaints and Symptoms: Positive for: Wounds Eyes Complaints and Symptoms: Review of System Notes: 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 Ear/Nose/Mouth/Throat Complaints and Symptoms: No Complaints or Symptoms Hematologic/Lymphatic Ucci, Sweta ([id number]) Complaints and Symptoms: No Complaints or Symptoms Respiratory Complaints and Symptoms: No Complaints or Symptoms Cardiovascular Complaints and Symptoms: Review of System Notes: hyperlipidemia Medical History: Positive for: Hypertension Gastrointestinal Complaints and Symptoms: Review of System Notes: GERD Endocrine Medical History: Positive for: Type II Diabetes Time with diabetes: 30 yrs Treated with: Insulin, Oral agents Blood sugar tested every day: Yes Tested : Genitourinary Complaints and Symptoms: No Complaints or Symptoms Immunological Complaints and Symptoms: No Complaints or  Symptoms Musculoskeletal Complaints and Symptoms: No Complaints or Symptoms Neurologic Complaints and Symptoms: No Complaints or Symptoms Turk, Koral ([id number]) Oncologic Complaints and Symptoms: No Complaints or Symptoms Psychiatric Complaints and Symptoms: No Complaints or Symptoms Immunizations Pneumococcal Vaccine: Received Pneumococcal Vaccination: Yes Family and Social History Never smoker; Alcohol Use: Never; Drug Use: No History; Financial Concerns: No; Food, Clothing or Shelter Needs: No; Support System Lacking: No; Transportation Concerns: No; Advanced Directives: No; Patient does not want information on Advanced Directives; Do not resuscitate: No; Living Will: No; Medical Power of Attorney: No Physician Affirmation I have reviewed and agree with the above information. Electronic Signature(s) Signed: 12/18/2016 4:16:12 PM By: Evlyn Kanner MD, FACS Signed: 12/18/2016 4:17:25 PM By: Alejandro Mulling Entered By: Alejandro Mulling on 12/18/2016 13:20:13 Rushing, Ayesha ([id number]) -------------------------------------------------------------------------------- SuperBill Details Patient Name: Christella Noa Date of Service: 12/18/2016 Medical Record Number: [id number] Patient Account Number: [id number] Date of Birth/Sex: 01-Aug-1941 (75 y.o. Female) Treating RN: Ashok Cordia, Debi Primary Care Provider: Durel Salts Other Clinician: Referring Provider: Durel Salts Treating Provider/Extender: Rudene Re in Treatment: 0 Diagnosis Coding ICD-10 Codes Code Description E11.621 Type 2 diabetes mellitus with foot ulcer L97.512 Non-pressure chronic ulcer of other part of right foot with fat layer exposed I73.9 Peripheral vascular disease, unspecified Facility Procedures CPT4 Code Description: 24401027 99213 - WOUND CARE VISIT-LEV 3 EST PT Modifier: Quantity: 1 CPT4 Code Description: 25366440 11042 - DEB SUBQ TISSUE 20 SQ CM/< ICD-10 Description Diagnosis E11.621 Type 2 diabetes  mellitus with foot ulcer L97.512 Non-pressure chronic ulcer of other part of right foo I73.9 Peripheral vascular disease, unspecified Modifier: t with fat la Quantity: 1 yer exposed Physician Procedures CPT4 Code Description: [id number] 99204 - WC PHYS LEVEL 4 - NEW PT ICD-10 Description Diagnosis E11.621 Type 2 diabetes mellitus with foot ulcer L97.512 Non-pressure  chronic ulcer of other part of right fo I73.9 Peripheral vascular disease, unspecified Modifier: 25 ot with fat lay Quantity: 1 er exposed CPT4 Code Description: 1610960 11042 - WC PHYS SUBQ TISS 20 SQ CM ICD-10 Description Diagnosis E11.621 Type 2 diabetes mellitus with foot ulcer L97.512 Non-pressure chronic ulcer of other part of right fo I73.9 Peripheral vascular disease, unspecified  Hughlett, Kynlee ([id number]) Modifier: ot with fat lay Quantity: 1 er exposed Electronic Signature(s) Signed: 12/18/2016 4:35:28 PM By: Evlyn Kanner MD, FACS Previous Signature: 12/18/2016 4:16:12 PM Version By: Evlyn Kanner MD, FACS Previous Signature: 12/18/2016 4:17:25 PM Version By: Alejandro Mulling Previous Signature: 12/18/2016 11:38:02 AM Version By: Evlyn Kanner MD, FACS Entered By: Evlyn Kanner on 12/18/2016 16:35:27

## 2016-12-19 NOTE — Progress Notes (Signed)
Janice Winters, Janice Winters ([id number]) Visit Report for 12/18/2016 Abuse/Suicide Risk Screen Details Patient Name: Janice Winters, Janice Winters 12/18/2016 10:30 Date of Service: AM Medical Record [id number] Number: Patient Account Number: [id number] 05-20-1941 (76 y.o. Treating RN: Phillis Haggis Date of Birth/Sex: Female) Other Clinician: Primary Care Abhijay Morriss: Durel Salts Treating Britto, Errol Referring Julee Stoll: Durel Salts Sloan Galentine/Extender: Tania Ade in Treatment: 0 Abuse/Suicide Risk Screen Items Answer ABUSE/SUICIDE RISK SCREEN: Has anyone close to you tried to hurt or harm you recentlyo No Do you feel uncomfortable with anyone in your familyo No Has anyone forced you do things that you didnot want to doo No Do you have any thoughts of harming yourselfo No Patient displays signs or symptoms of abuse and/or neglect. No Electronic Signature(s) Signed: 12/18/2016 4:17:25 PM By: Alejandro Mulling Entered By: Alejandro Mulling on 12/18/2016 13:20:21 Rew, Leafy Kindle ([id number]) -------------------------------------------------------------------------------- Activities of Daily Living Details Patient Name: Janice Winters, Janice Winters 12/18/2016 10:30 Date of Service: AM Medical Record [id number] Number: Patient Account Number: [id number] [birth year] (75 y.o. Treating RN: Phillis Haggis Date of Birth/Sex: Female) Other Clinician: Primary Care Sherley Leser: Durel Salts Treating Britto, Errol Referring Neriyah Cercone: Durel Salts Tyronza Happe/Extender: Tania Ade in Treatment: 0 Activities of Daily Living Items Answer Activities of Daily Living (Please select one for each item) Drive Automobile Not Able Take Medications Completely Able Use Telephone Completely Able Care for Appearance Completely Able Use Toilet Completely Able Bath / Shower Completely Able Dress Self Completely Able Feed Self Completely Able Walk Completely Able Get In / Out Bed Completely Able Housework Completely Able Prepare Meals Completely Able Handle Money Need  Assistance Shop for Self Need Assistance Electronic Signature(s) Signed: 12/18/2016 4:17:25 PM By: Alejandro Mulling Entered By: Alejandro Mulling on 12/18/2016 13:21:00 Ogletree, Zyanya ([id number]) -------------------------------------------------------------------------------- Education Assessment Details Patient Name: Janice Winters, Janice Winters 12/18/2016 10:30 Date of Service: AM Medical Record [id number] Number: Patient Account Number: [id number] 02/19/1941 (75 y.o. Treating RN: Phillis Haggis Date of Birth/Sex: Female) Other Clinician: Primary Care Terril Chestnut: Durel Salts Treating Britto, Errol Referring Hussein Macdougal: Durel Salts Shakina Choy/Extender: Tania Ade in Treatment: 0 Primary Learner Assessed: Caregiver DAUGHTER Learning Preferences/Education Level/Primary Language Learning Preference: Explanation, Demonstration, Printed Material Preferred Language: Other: KAREN Cognitive Barrier Assessment/Beliefs Language Barrier: Yes ID # [id number] Translator Needed: Yes Contract Interpreting Service Memory Deficit: No Emotional Barrier: No Cultural/Religious Beliefs Affecting Medical No Care: Physical Barrier Assessment Impaired Vision: No Impaired Hearing: Yes HOH Decreased Hand dexterity: No Knowledge/Comprehension Assessment Knowledge Level: Medium Comprehension Level: Medium Ability to understand written Medium instructions: Ability to understand verbal Medium instructions: Motivation Assessment Anxiety Level: Calm Cooperation: Cooperative Education Importance: Acknowledges Need Interest in Health Problems: Asks Questions Perception: Coherent Willingness to Engage in Self- Medium Management Activities: Readiness to Engage in Self- Medium Management Activities: Dozier, Rakiya ([id number]) Electronic Signature(s) Signed: 12/18/2016 4:17:25 PM By: Alejandro Mulling Entered By: Alejandro Mulling on 12/18/2016 13:22:25 Whalin, Chalene  ([id number]) -------------------------------------------------------------------------------- Fall Risk Assessment Details Patient Name: Janice Winters, Janice Winters 12/18/2016 10:30 Date of Service: AM Medical Record [id number] Number: Patient Account Number: [id number] March 14, 1941 (75 y.o. Treating RN: Phillis Haggis Date of Birth/Sex: Female) Other Clinician: Primary Care Veroncia Jezek: Durel Salts Treating Britto, Errol Referring Amear Strojny: Durel Salts Caryssa Elzey/Extender: Tania Ade in Treatment: 0 Fall Risk Assessment Items Have you had 2 or more falls in the last 12 monthso 0 No Have you had any fall that resulted in injury in the last 12 monthso 0 No FALL RISK ASSESSMENT: History of falling - immediate or within 3 months 0 No Secondary diagnosis 15 Yes Ambulatory aid None/bed rest/wheelchair/nurse 0 No Crutches/cane/walker 0 No Furniture 0 No IV Access/Saline  Lock 0 No Gait/Training Normal/bed rest/immobile 0 No Weak 10 Yes Impaired 0 No Mental Status Oriented to own ability 0 Yes Electronic Signature(s) Signed: 12/18/2016 4:17:25 PM By: Alejandro Mulling Entered By: Alejandro Mulling on 12/18/2016 13:22:29 Gradillas, Jazelle ([id number]) -------------------------------------------------------------------------------- Foot Assessment Details Patient Name: Janice Winters, Janice Winters 12/18/2016 10:30 Date of Service: AM Medical Record [id number] Number: Patient Account Number: [id number] 05/02/1941 (76 y.o. Treating RN: Phillis Haggis Date of Birth/Sex: Female) Other Clinician: Primary Care Danylle Ouk: Durel Salts Treating Britto, Errol Referring Georganne Siple: Durel Salts Tamaiya Bump/Extender: Tania Ade in Treatment: 0 Foot Assessment Items Site Locations + = Sensation present, - = Sensation absent, C = Callus, U = Ulcer R = Redness, W = Warmth, M = Maceration, PU = Pre-ulcerative lesion F = Fissure, S = Swelling, D = Dryness Assessment Right: Left: Other Deformity: No No Prior Foot Ulcer: No No Prior Amputation: No  No Charcot Joint: No No Ambulatory Status: Ambulatory Without Help Gait: Steady Electronic Signature(s) Signed: 12/18/2016 4:17:25 PM By: Alejandro Mulling Entered By: Alejandro Mulling on 12/18/2016 11:03:12 Bluestone, Josepha ([id number]) -------------------------------------------------------------------------------- Nutrition Risk Assessment Details Patient Name: Janice Winters, Janice Winters 12/18/2016 10:30 Date of Service: AM Medical Record [id number] Number: Patient Account Number: [id number] [birth year] (76 y.o. Treating RN: Phillis Haggis Date of Birth/Sex: Female) Other Clinician: Primary Care Guerin Lashomb: Durel Salts Treating Britto, Errol Referring Bernard Slayden: Durel Salts Xaivier Malay/Extender: Tania Ade in Treatment: 0 Height (in): Weight (lbs): Body Mass Index (BMI): Nutrition Risk Assessment Items NUTRITION RISK SCREEN: I have an illness or condition that made me change the kind and/or 2 Yes amount of food I eat I eat fewer than two meals per day 3 Yes I eat few fruits and vegetables, or milk products 0 No I have three or more drinks of beer, liquor or wine almost every day 0 No I have tooth or mouth problems that make it hard for me to eat 0 No I don't always have enough money to buy the food I need 0 No I eat alone most of the time 0 No I take three or more different prescribed or over-the-counter drugs a 1 Yes day Without wanting to, I have lost or gained 10 pounds in the last six 0 No months I am not always physically able to shop, cook and/or feed myself 0 No Nutrition Protocols Good Risk Protocol Moderate Risk Protocol Electronic Signature(s) Signed: 12/18/2016 4:17:25 PM By: Alejandro Mulling Entered By: Alejandro Mulling on 12/18/2016 10:34:21

## 2016-12-19 NOTE — Progress Notes (Addendum)
Janice Winters ([id number]) Visit Report for 12/18/2016 Allergy List Details Patient Name: Janice Winters, Janice Winters Date of Service: 12/18/2016 10:30 AM Medical Record Number: [id number] Patient Account Number: [id number] Date of Birth/Sex: 01-06-1941 (76 y.o. Female) Treating RN: Ashok Cordia, Debi Primary Care Beatriz Quintela: Durel Salts Other Clinician: Referring Janice Winters: Durel Salts Treating Janice Winters/Extender: Janice Winters in Treatment: 0 Allergies Active Allergies aspirin oxycodone minocycline Allergy Notes Electronic Signature(s) Signed: 12/18/2016 4:17:25 PM By: Janice Winters Entered By: Janice Winters on 12/18/2016 10:26:00 Janice Winters, Janice Winters ([id number]) -------------------------------------------------------------------------------- Arrival Information Details Patient Name: Janice Winters Date of Service: 12/18/2016 10:30 AM Medical Record Number: [id number] Patient Account Number: [id number] Date of Birth/Sex: 1941-08-12 (76 y.o. Female) Treating RN: Ashok Cordia, Debi Primary Care Doretha Goding: Durel Salts Other Clinician: Referring Dorrell Mitcheltree: Durel Salts Treating Janice Winters/Extender: Janice Winters in Treatment: 0 Visit Information Patient Arrived: Ambulatory Arrival Time: 10:21 Accompanied By: daughter, intreptor by phone ID [id number] Transfer Assistance: None Patient Identification Yes Verified: Secondary Verification Yes Process Completed: Patient Requires No Transmission-Based Precautions: Patient Has Alerts: Yes Patient Alerts: DM II Electronic Signature(s) Signed: 12/18/2016 4:17:25 PM By: Janice Winters Entered By: Janice Winters on 12/18/2016 11:51:57 Janice Winters, Janice Winters ([id number]) -------------------------------------------------------------------------------- Clinic Level of Care Assessment Details Patient Name: Janice Winters Date of Service: 12/18/2016 10:30 AM Medical Record Number: [id number] Patient Account Number: [id number] Date of Birth/Sex: 03/09/1941 (76 y.o. Female) Treating  RN: Ashok Cordia, Debi Primary Care Sidrah Harden: Durel Salts Other Clinician: Referring Grayson Pfefferle: Durel Salts Treating Jaine Estabrooks/Extender: Janice Winters in Treatment: 0 Clinic Level of Care Assessment Items TOOL 1 Quantity Score X - Use when EandM and Procedure is performed on INITIAL visit 1 0 ASSESSMENTS - Nursing Assessment / Reassessment X - General Physical Exam (combine w/ comprehensive assessment (listed just 1 20 below) when performed on new pt. evals) X - Comprehensive Assessment (HX, ROS, Risk Assessments, Wounds Hx, etc.) 1 25 ASSESSMENTS - Wound and Skin Assessment / Reassessment  - Dermatologic / Skin Assessment (not related to wound area) 0 ASSESSMENTS - Ostomy and/or Continence Assessment and Care  - Incontinence Assessment and Management 0  - Ostomy Care Assessment and Management (repouching, etc.) 0 PROCESS - Coordination of Care  - Simple Patient / Family Education for ongoing care 0 X - Complex (extensive) Patient / Family Education for ongoing care 1 20 X - Staff obtains Chiropractor, Records, Test Results / Process Orders 1 10 X - Staff telephones HHA, Nursing Homes / Clarify orders / etc 1 10  - Routine Transfer to another Facility (non-emergent condition) 0  - Routine Hospital Admission (non-emergent condition) 0 X - New Admissions / Manufacturing engineer / Ordering NPWT, Apligraf, etc. 1 15  - Emergency Hospital Admission (emergent condition) 0 PROCESS - Special Needs  - Pediatric / Minor Patient Management 0  - Isolation Patient Management 0 Janice Winters, Janice Winters ([id number])  - Hearing / Language / Visual special needs 0  - Assessment of Community assistance (transportation, D/C planning, etc.) 0  - Additional assistance / Altered mentation 0  - Support Surface(s) Assessment (bed, cushion, seat, etc.) 0 INTERVENTIONS - Miscellaneous  - External ear exam 0  - Patient Transfer (multiple staff / Nurse, adult / Similar devices) 0  -  Simple Staple / Suture removal (25 or less) 0  - Complex Staple / Suture removal (26 or more) 0  - Hypo/Hyperglycemic Management (do not check if billed separately) 0 X - Ankle / Brachial Index (ABI) - do not check if billed separately 1 15 Has the patient been seen at the hospital within the last  three years: Yes Total Score: 115 Level Of Care: New/Established - Level 3 Electronic Signature(s) Signed: 12/18/2016 4:17:25 PM By: Janice Winters Entered By: Janice Winters on 12/18/2016 12:35:06 Janice Winters ([id number]) -------------------------------------------------------------------------------- Encounter Discharge Information Details Patient Name: Janice Winters Date of Service: 12/18/2016 10:30 AM Medical Record Number: [id number] Patient Account Number: [id number] Date of Birth/Sex: Dec 02, 1940 (76 y.o. Female) Treating RN: Ashok Cordia, Debi Primary Care Niranjan Rufener: Durel Salts Other Clinician: Referring Jamilah Jean: Durel Salts Treating Paxton Kanaan/Extender: Janice Winters in Treatment: 0 Encounter Discharge Information Items Discharge Pain Level: 0 Discharge Condition: Stable Ambulatory Status: Ambulatory Discharge Destination: Home Transportation: Private Auto Accompanied By: daughter, FNP Schedule Follow-up Appointment: Yes Medication Reconciliation completed and provided to Patient/Care No Gladys Deckard: Provided on Clinical Summary of Care: 12/18/2016 Form Type Recipient Paper Patient BP Electronic Signature(s) Signed: 12/18/2016 11:41:43 AM By: Gwenlyn Perking Entered By: Gwenlyn Perking on 12/18/2016 11:41:42 Janice Winters ([id number]) -------------------------------------------------------------------------------- General Visit Notes Details Patient Name: Janice Winters Date of Service: 12/18/2016 10:30 AM Medical Record Number: [id number] Patient Account Number: [id number] Date of Birth/Sex: [birth year] (76 y.o. Female) Treating RN: Ashok Cordia, Debi Primary Care Burnett Spray: Durel Salts Other Clinician: Referring Kinsie Belford: Durel Salts Treating Momen Ham/Extender: Janice Winters in Treatment: 0 Notes We did not have an interpreter to come to the visit so we had to get one over the phone using the language line. Interpreter ID # [id number]. Electronic Signature(s) Signed: 12/18/2016 11:52:57 AM By: Janice Winters Entered By: Janice Winters on 12/18/2016 11:52:57 Canale, Kenetra ([id number]) -------------------------------------------------------------------------------- Lower Extremity Assessment Details Patient Name: Janice Winters Date of Service: 12/18/2016 10:30 AM Medical Record Number: [id number] Patient Account Number: [id number] Date of Birth/Sex: Jun 10, 1941 (76 y.o. Female) Treating RN: Ashok Cordia, Debi Primary Care Ryle Buscemi: Durel Salts Other Clinician: Referring Jaaliyah Lucatero: Durel Salts Treating Alberta Lenhard/Extender: Janice Winters in Treatment: 0 Vascular Assessment Pulses: Dorsalis Pedis Palpable: [Right:No] Doppler Audible: [Right:Yes] Posterior Tibial Extremity colors, hair growth, and conditions: Extremity Color: [Right:Normal] Temperature of Extremity: [Right:Warm] Capillary Refill: [Right:< 3 seconds] Blood Pressure: Brachial: [Right:240] Dorsalis Pedis: [Left:Dorsalis Pedis: 110] Ankle: Posterior Tibial: [Left:Posterior Tibial: 120] [Right:0.50] Toe Nail Assessment Left: Right: Thick: Yes Discolored: Yes Deformed: Yes Improper Length and Hygiene: No Electronic Signature(s) Signed: 12/18/2016 4:17:25 PM By: Janice Winters Entered By: Janice Winters on 12/18/2016 10:54:06 Thorley, Raley ([id number]) -------------------------------------------------------------------------------- Multi Wound Chart Details Patient Name: Janice Winters Date of Service: 12/18/2016 10:30 AM Medical Record Number: [id number] Patient Account Number: [id number] Date of Birth/Sex: 11-Dec-1940 (76 y.o. Female) Treating RN: Ashok Cordia, Debi Primary Care Aveena Bari:  Durel Salts Other Clinician: Referring Ciela Mahajan: Durel Salts Treating Normalee Sistare/Extender: Janice Winters in Treatment: 0 Vital Signs Height(in): 58 Pulse(bpm): 64 Weight(lbs): 94.3 Blood Pressure 209/74 (mmHg): Body Mass Index(BMI): 20 Temperature(F): 97.7 Respiratory Rate 16 (breaths/min): Photos: [1:No Photos] [N/A:N/A] Wound Location: [1:Right Toe Great - Plantar] [N/A:N/A] Wounding Event: [1:Gradually Appeared] [N/A:N/A] Primary Etiology: [1:Diabetic Wound/Ulcer of the Lower Extremity] [N/A:N/A] Comorbid History: [1:Type II Diabetes] [N/A:N/A] Date Acquired: [1:09/19/2016] [N/A:N/A] Weeks of Treatment: [1:0] [N/A:N/A] Wound Status: [1:Open] [N/A:N/A] Measurements L x W x D 2.7x1x0.1 [N/A:N/A] (cm) Area (cm) : [1:2.121] [N/A:N/A] Volume (cm) : [1:0.212] [N/A:N/A] % Reduction in Area: [1:0.00%] [N/A:N/A] % Reduction in Volume: 0.00% [N/A:N/A] Classification: [1:Grade 1] [N/A:N/A] Exudate Amount: [1:Large] [N/A:N/A] Exudate Type: [1:Serous] [N/A:N/A] Exudate Color: [1:amber] [N/A:N/A] Wound Margin: [1:Distinct, outline attached] [N/A:N/A] Granulation Amount: [1:None Present (0%)] [N/A:N/A] Necrotic Amount: [1:Large (67-100%)] [N/A:N/A] Exposed Structures: [1:Fascia: No Fat Layer (Subcutaneous Tissue) Exposed: No Tendon: No Muscle: No Joint: No Bone: No] [N/A:N/A] Limited to Skin Breakdown Epithelialization: None  N/A N/A Debridement: Debridement (16109- N/A N/A 11047) Pre-procedure 11:10 N/A N/A Verification/Time Out Taken: Pain Control: Lidocaine 4% Topical N/A N/A Solution Tissue Debrided: Fibrin/Slough, Exudates, N/A N/A Subcutaneous Level: Skin/Subcutaneous N/A N/A Tissue Debridement Area (sq 2.7 N/A N/A cm): Instrument: Forceps, Scissors N/A N/A Bleeding: Minimum N/A N/A Hemostasis Achieved: Pressure N/A N/A Procedural Pain: 0 N/A N/A Post Procedural Pain: 0 N/A N/A Debridement Treatment Procedure was tolerated N/A N/A Response:  well Post Debridement 2.7x1x0.1 N/A N/A Measurements L x W x D (cm) Post Debridement 0.212 N/A N/A Volume: (cm) Periwound Skin Texture: No Abnormalities Noted N/A N/A Periwound Skin Maceration: Yes N/A N/A Moisture: Periwound Skin Color: No Abnormalities Noted N/A N/A Temperature: No Abnormality N/A N/A Tenderness on Yes N/A N/A Palpation: Wound Preparation: Ulcer Cleansing: N/A N/A Rinsed/Irrigated with Saline Topical Anesthetic Applied: Other: lidocaine 4% Procedures Performed: Debridement N/A N/A Treatment Notes Wound #1 (Right, Plantar Toe Great) 1. Cleansed with: Clean wound with Normal Saline 2. Anesthetic Janice Winters, Janice Winters ([id number]) Topical Lidocaine 4% cream to wound bed prior to debridement 3. Peri-wound Care: Skin Prep 4. Dressing Applied: Santyl Ointment 5. Secondary Dressing Applied Dry Gauze Kerlix/Conform 6. Footwear/Offloading device applied Felt/Foam 7. Secured with Secretary/administrator) Signed: 12/18/2016 11:26:58 AM By: Evlyn Kanner MD, FACS Entered By: Evlyn Kanner on 12/18/2016 11:26:57 Janice Winters, Janice Winters ([id number]) -------------------------------------------------------------------------------- Multi-Disciplinary Care Plan Details Patient Name: Janice Winters Date of Service: 12/18/2016 10:30 AM Medical Record Number: [id number] Patient Account Number: [id number] Date of Birth/Sex: 1941-02-07 (76 y.o. Female) Treating RN: Ashok Cordia, Debi Primary Care Bomani Oommen: Durel Salts Other Clinician: Referring Elliott Quade: Durel Salts Treating Abella Shugart/Extender: Janice Winters in Treatment: 0 Active Inactive Electronic Signature(s) Signed: 12/31/2016 8:57:50 AM By: Elliot Gurney RN, BSN, Kim RN, BSN Signed: 01/05/2017 4:39:09 PM By: Janice Winters Previous Signature: 12/18/2016 4:17:25 PM Version By: Janice Winters Entered By: Elliot Gurney RN, BSN, Kim on 12/31/2016 08:57:50 Janice Winters, Janice Winters  ([id number]) -------------------------------------------------------------------------------- Pain Assessment Details Patient Name: Janice Winters Date of Service: 12/18/2016 10:30 AM Medical Record Number: [id number] Patient Account Number: [id number] Date of Birth/Sex: 28-Jan-1941 (76 y.o. Female) Treating RN: Ashok Cordia, Debi Primary Care Gustav Knueppel: Durel Salts Other Clinician: Referring Johnnay Pleitez: Durel Salts Treating Iyania Denne/Extender: Janice Winters in Treatment: 0 Active Problems Location of Pain Severity and Description of Pain Patient Has Paino Yes Site Locations Pain Location: Pain in Ulcers With Dressing Change: Yes Rate the pain. Current Pain Level: 9 Character of Pain Describe the Pain: Burning Pain Management and Medication Current Pain Management: Electronic Signature(s) Signed: 12/18/2016 4:17:25 PM By: Janice Winters Entered By: Janice Winters on 12/18/2016 10:24:14 Janice Winters, Janice Winters ([id number]) -------------------------------------------------------------------------------- Wound Assessment Details Patient Name: Janice Winters Date of Service: 12/18/2016 10:30 AM Medical Record Number: [id number] Patient Account Number: [id number] Date of Birth/Sex: [birth year] (76 y.o. Female) Treating RN: Ashok Cordia, Debi Primary Care Hellen Shanley: Durel Salts Other Clinician: Referring Lyndsi Altic: Durel Salts Treating Shenay Torti/Extender: Janice Winters in Treatment: 0 Wound Status Wound Number: 1 Primary Diabetic Wound/Ulcer of the Lower Etiology: Extremity Wound Location: Right Toe Great - Plantar Wound Status: Open Wounding Event: Gradually Appeared Comorbid Type II Diabetes Date Acquired: 09/19/2016 History: Weeks Of Treatment: 0 Clustered Wound: No Photos Photo Uploaded By: Janice Winters on 12/18/2016 12:37:42 Wound Measurements Length: (cm) 2.7 Width: (cm) 1 Depth: (cm) 0.1 Area: (cm) 2.121 Volume: (cm) 0.212 % Reduction in Area: 0% % Reduction in  Volume: 0% Epithelialization: None Tunneling: No Undermining: No Wound Description Classification: Grade 1 Foul Odor Afte Wound Margin: Distinct, outline attached Slough/Fibrino Exudate Amount: Large Exudate Type: Serous Exudate  Color: amber r Cleansing: No Yes Wound Bed Granulation Amount: None Present (0%) Exposed Structure Necrotic Amount: Large (67-100%) Fascia Exposed: No Necrotic Quality: Adherent Slough Fat Layer (Subcutaneous Tissue) Exposed: No Tendon Exposed: No Janice Winters, Janice Winters ([id number]) Muscle Exposed: No Joint Exposed: No Bone Exposed: No Limited to Skin Breakdown Periwound Skin Texture Texture Color No Abnormalities Noted: No No Abnormalities Noted: No Moisture Temperature / Pain No Abnormalities Noted: No Temperature: No Abnormality Maceration: Yes Tenderness on Palpation: Yes Wound Preparation Ulcer Cleansing: Rinsed/Irrigated with Saline Topical Anesthetic Applied: Other: lidocaine 4%, Electronic Signature(s) Signed: 12/18/2016 4:17:25 PM By: Janice Winters Entered By: Janice Winters on 12/18/2016 11:07:13 Janice Winters, Janice Winters ([id number]) -------------------------------------------------------------------------------- Vitals Details Patient Name: Janice Winters Date of Service: 12/18/2016 10:30 AM Medical Record Number: [id number] Patient Account Number: [id number] Date of Birth/Sex: Jan 28, 1941 (76 y.o. Female) Treating RN: Ashok Cordia, Debi Primary Care Kyeshia Zinn: Durel Salts Other Clinician: Referring [place] Mckeough: Durel Salts Treating Amiracle Neises/Extender: Janice Winters in Treatment: 0 Vital Signs Time Taken: 10:35 Temperature (F): 97.7 Height (in): 58 Pulse (bpm): 64 Source: Stated Respiratory Rate (breaths/min): 16 Weight (lbs): 94.3 Blood Pressure (mmHg): 209/74 Source: Measured Reference Range: 80 - 120 mg / dl Body Mass Index (BMI): 19.7 Notes 218/90 manually Made Dr. Meyer Russel aware of pts BP also her nurse practitioner Anna Genre) was here as well  from [facility] and she is aware as well. Electronic Signature(s) Signed: 12/18/2016 4:17:25 PM By: Janice Winters Entered By: Janice Winters on 12/18/2016 13:29:37

## 2016-12-26 ENCOUNTER — Ambulatory Visit: Payer: Medicare (Managed Care) | Admitting: Physician Assistant

## 2016-12-29 ENCOUNTER — Other Ambulatory Visit: Payer: Self-pay | Admitting: Surgery

## 2016-12-29 DIAGNOSIS — L97512 Non-pressure chronic ulcer of other part of right foot with fat layer exposed: Secondary | ICD-10-CM

## 2016-12-30 ENCOUNTER — Encounter (INDEPENDENT_AMBULATORY_CARE_PROVIDER_SITE_OTHER): Payer: Medicare (Managed Care)

## 2016-12-30 ENCOUNTER — Encounter (INDEPENDENT_AMBULATORY_CARE_PROVIDER_SITE_OTHER): Payer: Medicare (Managed Care) | Admitting: Vascular Surgery

## 2017-11-04 IMAGING — CR DG FOOT COMPLETE 3+V*R*
1 series · 3 of 3 positions shown · non-contrast
Comparison: No prior .

CLINICAL DATA: Right foot infection.  Nonhealing wound.

EXAM:
RIGHT FOOT COMPLETE - 3+ VIEW

[Series 1: x foot ap right · 0.14mm/px · 3 of 3 slices shown]
[im 1/3]
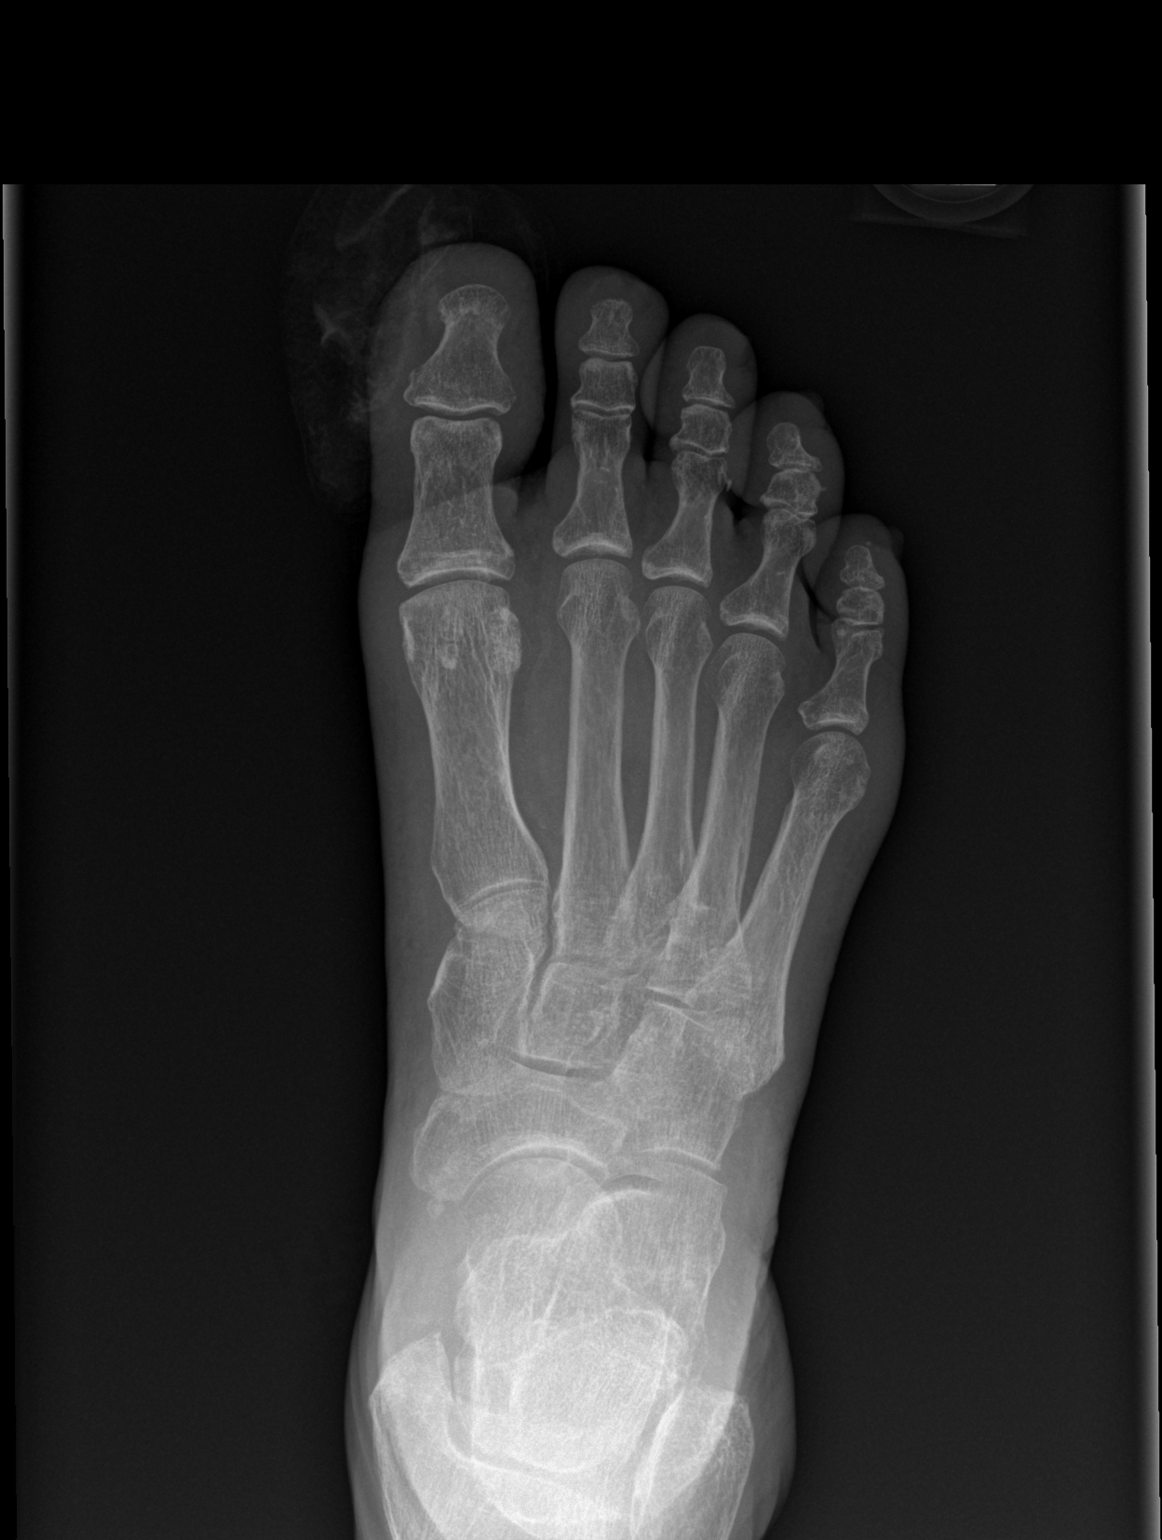
[im 2/3]
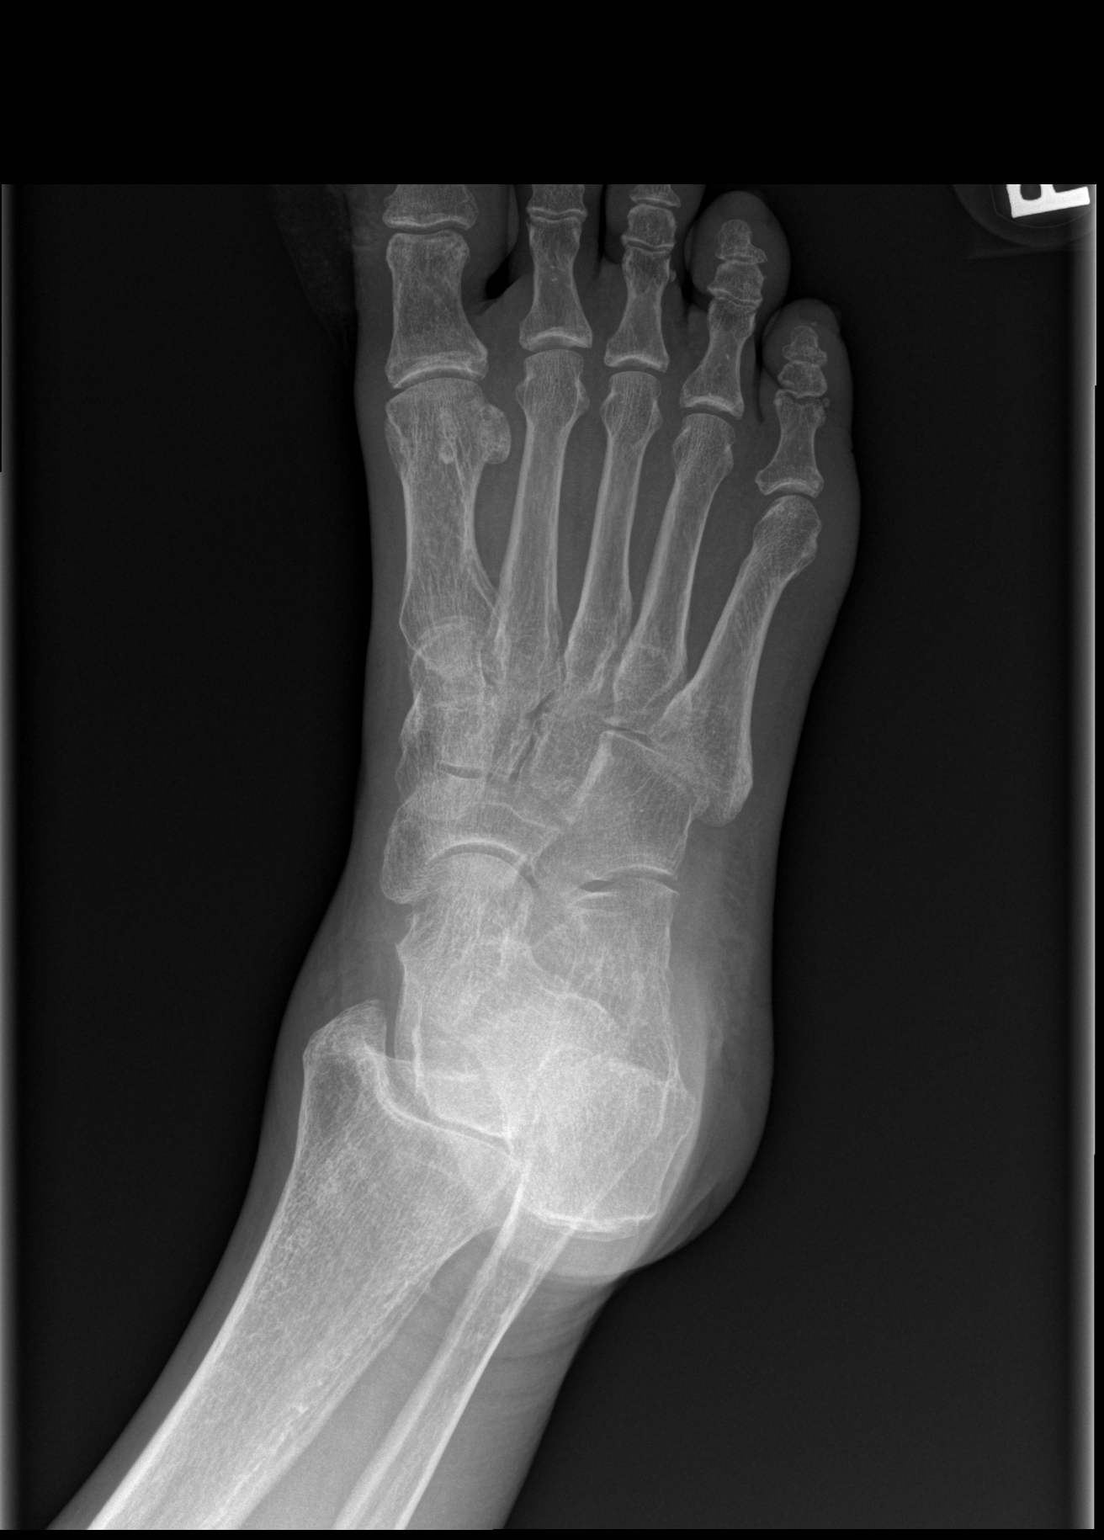
[im 3/3]
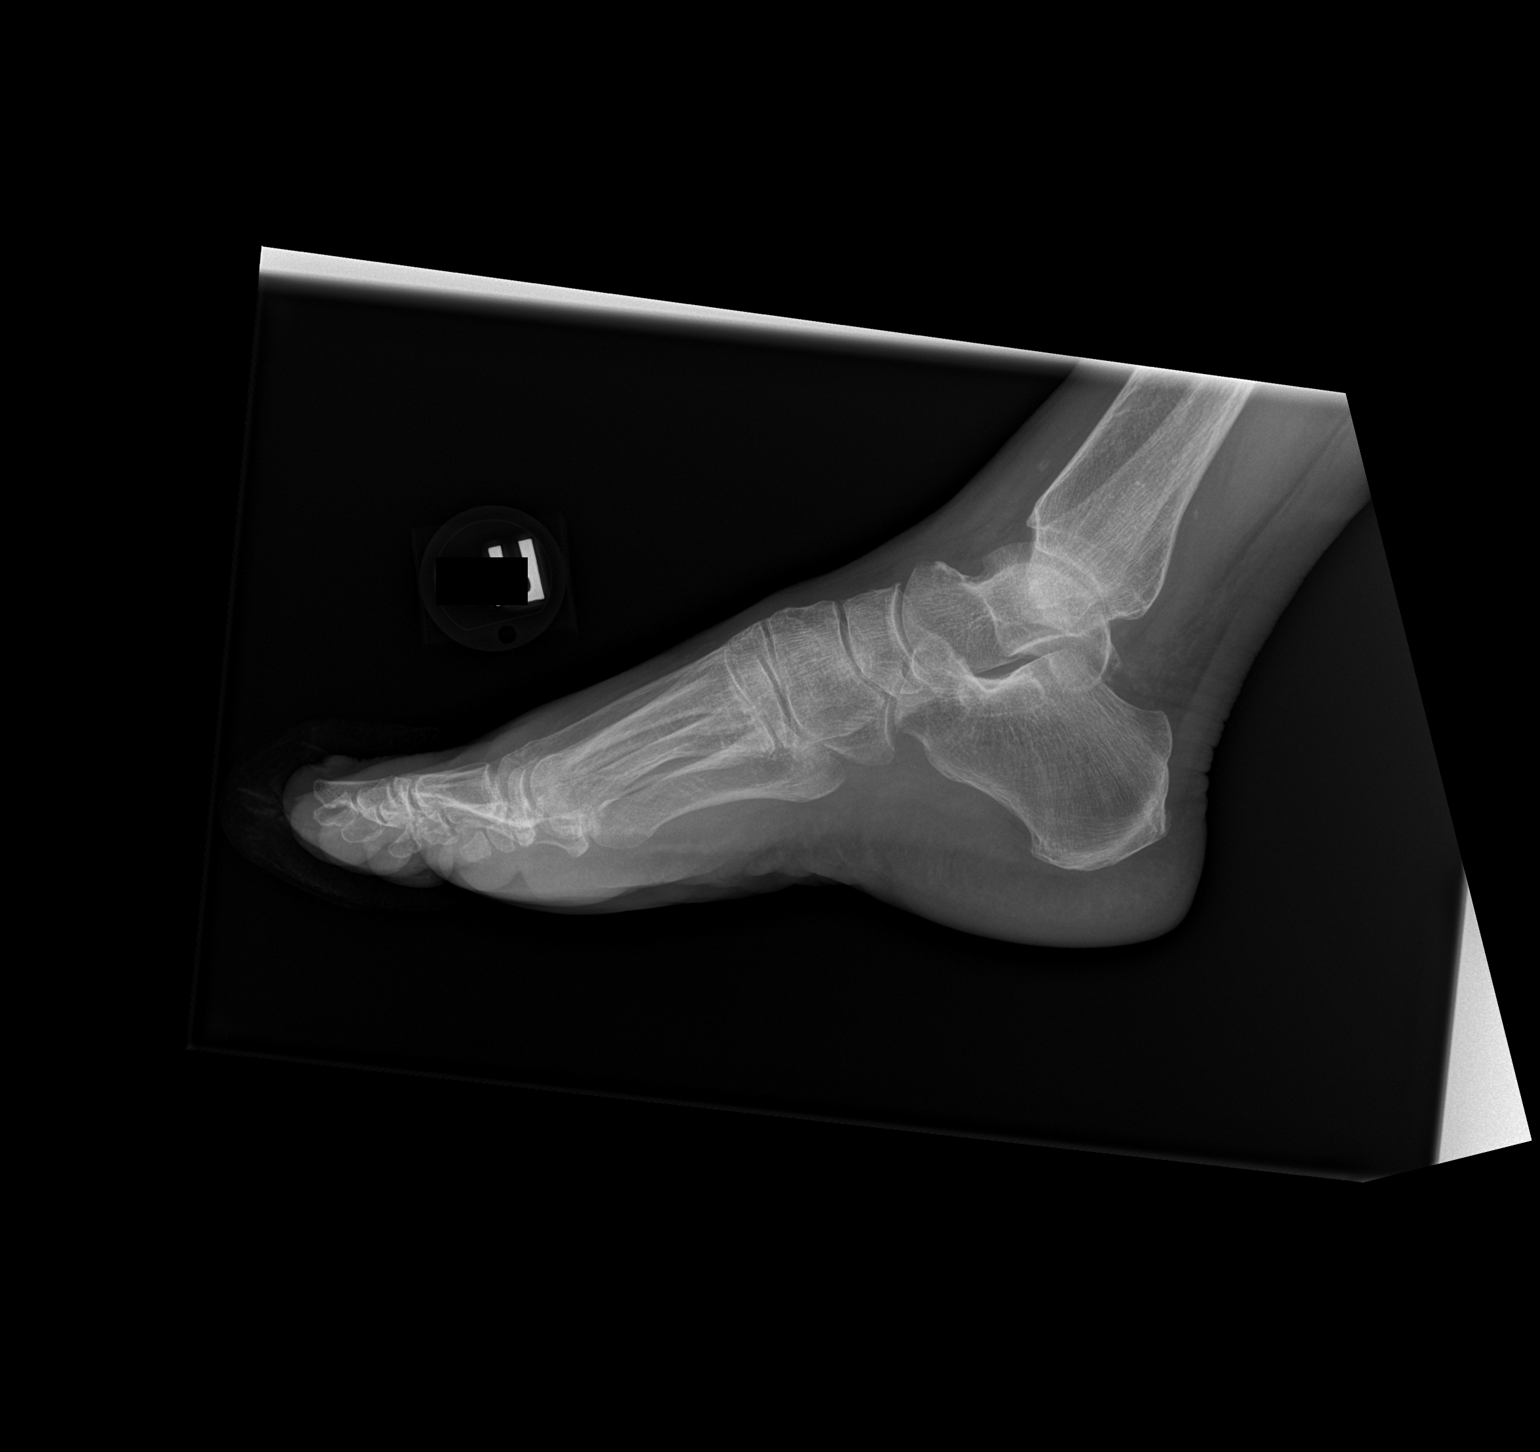

[3 of 3 positions shown; findings below may reference images not displayed]

FINDINGS: Soft tissue wound right great toe. No radiopaque foreign body. No
acute bony abnormality identified. No underlying bony abnormality.
IMPRESSION: Soft-tissue swelling right great toe. No radiopaque foreign body. No
acute bony abnormality identified.

## 2018-01-30 DEATH — deceased
